# Patient Record
Sex: Female | Born: 1965 | Hispanic: No | Marital: Married | Smoking: Current every day smoker
Health system: Southern US, Community
[De-identification: ages and names within clinical notes are randomized; demographics above are authoritative.]

## PROBLEM LIST (undated history)

## (undated) DIAGNOSIS — M1990 Unspecified osteoarthritis, unspecified site: Secondary | ICD-10-CM

## (undated) HISTORY — PX: KNEE SURGERY: SHX244

## (undated) HISTORY — PX: BREAST ENHANCEMENT SURGERY: SHX7

---

## 1998-08-05 ENCOUNTER — Ambulatory Visit (HOSPITAL_COMMUNITY): Admission: RE | Admit: 1998-08-05 | Discharge: 1998-08-05 | Payer: Self-pay | Admitting: *Deleted

## 1998-09-08 ENCOUNTER — Ambulatory Visit (HOSPITAL_COMMUNITY): Admission: RE | Admit: 1998-09-08 | Discharge: 1998-09-08 | Payer: Self-pay | Admitting: Obstetrics & Gynecology

## 1999-01-03 ENCOUNTER — Encounter: Payer: Self-pay | Admitting: *Deleted

## 1999-01-03 ENCOUNTER — Inpatient Hospital Stay (HOSPITAL_COMMUNITY): Admission: AD | Admit: 1999-01-03 | Discharge: 1999-01-03 | Payer: Self-pay | Admitting: *Deleted

## 1999-01-31 ENCOUNTER — Inpatient Hospital Stay (HOSPITAL_COMMUNITY): Admission: AD | Admit: 1999-01-31 | Discharge: 1999-02-03 | Payer: Self-pay | Admitting: Obstetrics & Gynecology

## 1999-02-06 ENCOUNTER — Encounter (HOSPITAL_COMMUNITY): Admission: RE | Admit: 1999-02-06 | Discharge: 1999-05-07 | Payer: Self-pay | Admitting: *Deleted

## 2001-04-10 ENCOUNTER — Encounter: Admission: RE | Admit: 2001-04-10 | Discharge: 2001-04-10 | Payer: Self-pay

## 2009-08-02 ENCOUNTER — Ambulatory Visit (HOSPITAL_BASED_OUTPATIENT_CLINIC_OR_DEPARTMENT_OTHER): Admission: RE | Admit: 2009-08-02 | Discharge: 2009-08-02 | Payer: Self-pay | Admitting: Obstetrics and Gynecology

## 2009-08-02 ENCOUNTER — Ambulatory Visit: Payer: Self-pay | Admitting: Diagnostic Radiology

## 2009-08-02 ENCOUNTER — Ambulatory Visit: Payer: Self-pay | Admitting: Radiology

## 2010-11-09 ENCOUNTER — Other Ambulatory Visit: Payer: Self-pay | Admitting: Obstetrics and Gynecology

## 2010-11-21 ENCOUNTER — Other Ambulatory Visit (HOSPITAL_COMMUNITY)
Admission: RE | Admit: 2010-11-21 | Discharge: 2010-11-21 | Disposition: A | Discharge status: Home / Self Care | Payer: Managed Care, Other (non HMO) | Source: Ambulatory Visit | Attending: Obstetrics and Gynecology | Admitting: Obstetrics and Gynecology

## 2010-11-21 ENCOUNTER — Other Ambulatory Visit: Payer: Self-pay | Admitting: Obstetrics and Gynecology

## 2010-11-21 DIAGNOSIS — Z1159 Encounter for screening for other viral diseases: Secondary | ICD-10-CM | POA: Insufficient documentation

## 2010-11-21 DIAGNOSIS — Z1231 Encounter for screening mammogram for malignant neoplasm of breast: Secondary | ICD-10-CM

## 2010-11-21 DIAGNOSIS — Z01419 Encounter for gynecological examination (general) (routine) without abnormal findings: Secondary | ICD-10-CM | POA: Insufficient documentation

## 2010-11-29 ENCOUNTER — Ambulatory Visit (HOSPITAL_BASED_OUTPATIENT_CLINIC_OR_DEPARTMENT_OTHER)
Admission: RE | Admit: 2010-11-29 | Discharge: 2010-11-29 | Disposition: A | Discharge status: Home / Self Care | Payer: Managed Care, Other (non HMO) | Source: Ambulatory Visit | Attending: Obstetrics and Gynecology | Admitting: Obstetrics and Gynecology

## 2010-11-29 DIAGNOSIS — Z1231 Encounter for screening mammogram for malignant neoplasm of breast: Secondary | ICD-10-CM | POA: Insufficient documentation

## 2011-04-25 IMAGING — US US PELVIS COMPLETE
1 series · 14 of 25 positions shown · non-contrast
Comparison: None.

CLINICAL DATA: Enlarged uterus; family history of uterine cancer

TRANSABDOMINAL AND TRANSVAGINAL ULTRASOUND OF PELVIS
TECHNIQUE: Both transabdominal and transvaginal ultrasound
examinations of the pelvis were performed including evaluation of
the uterus, ovaries, adnexal regions, and pelvic cul-de-sac.

[Series 1: us pelvis complete · 0.30mm/px · 14 of 59 slices shown]
[im 1/59]
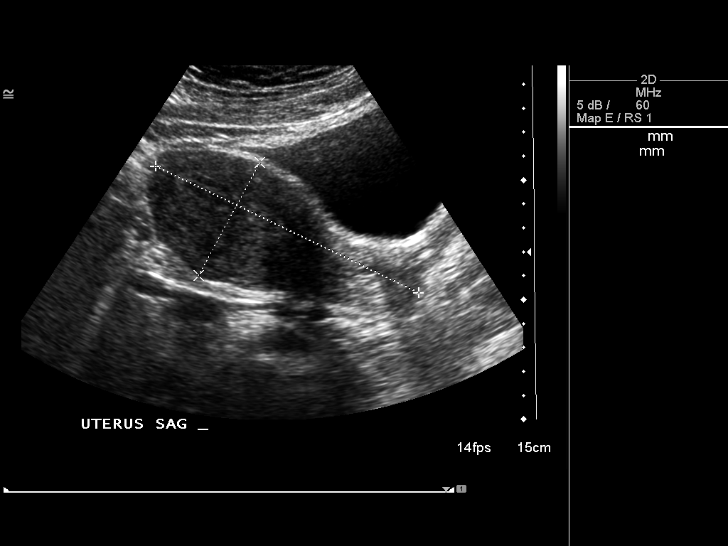
[im 5/59]
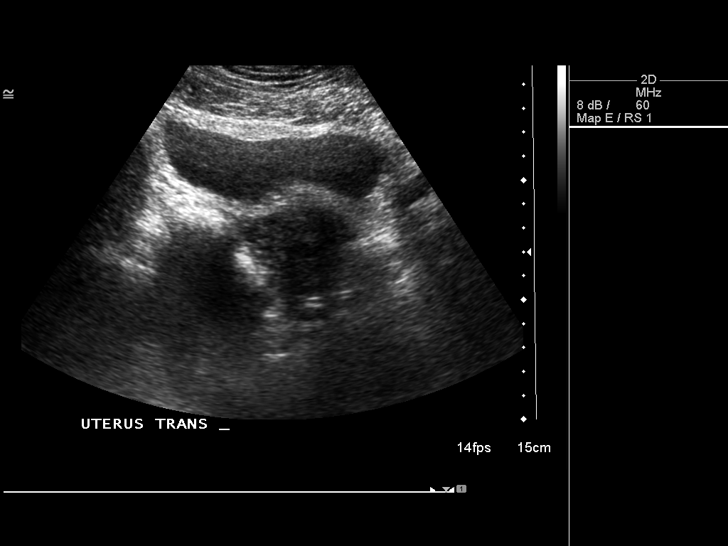
[im 10/59]
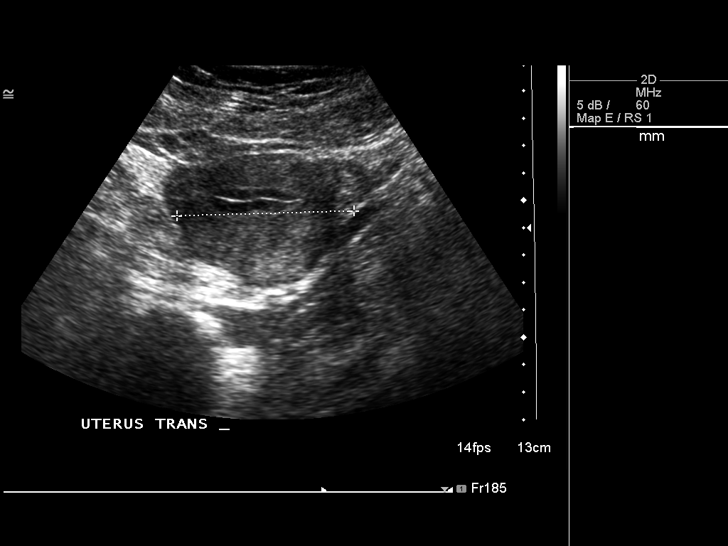
[im 15/59]
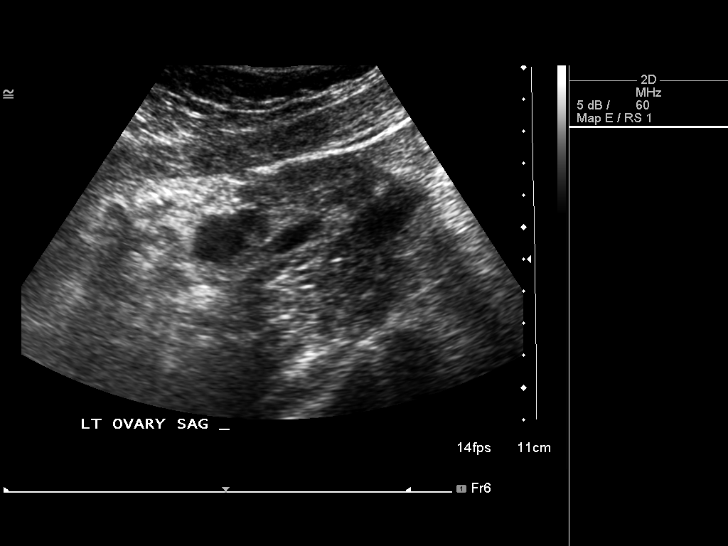
[im 20/59]
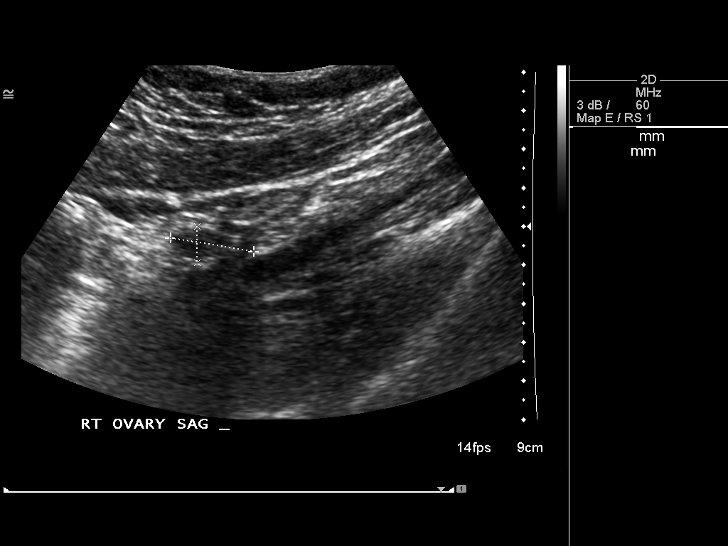
[im 22/59]
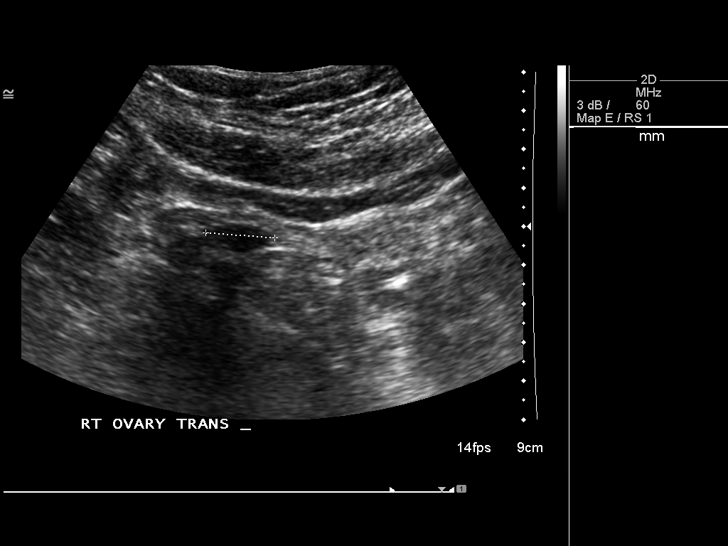
[im 27/59]
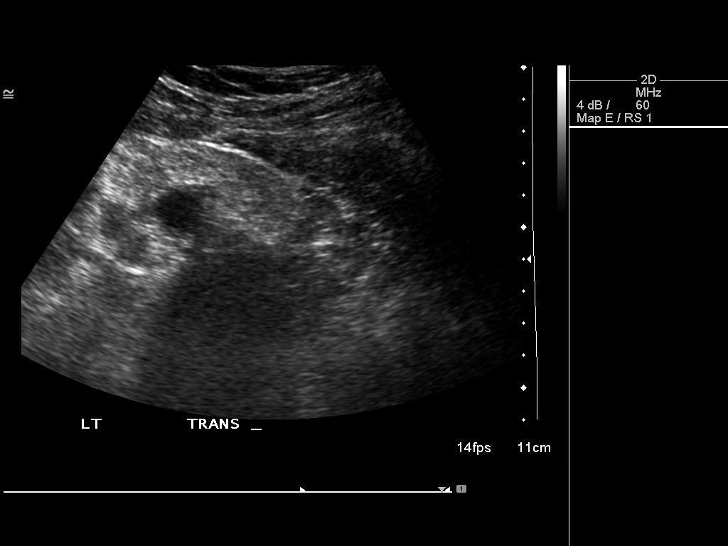
[im 32/59]
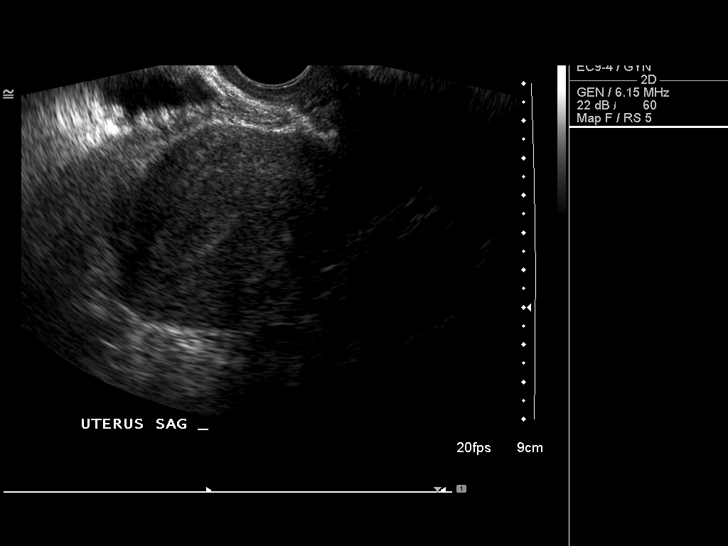
[im 37/59]
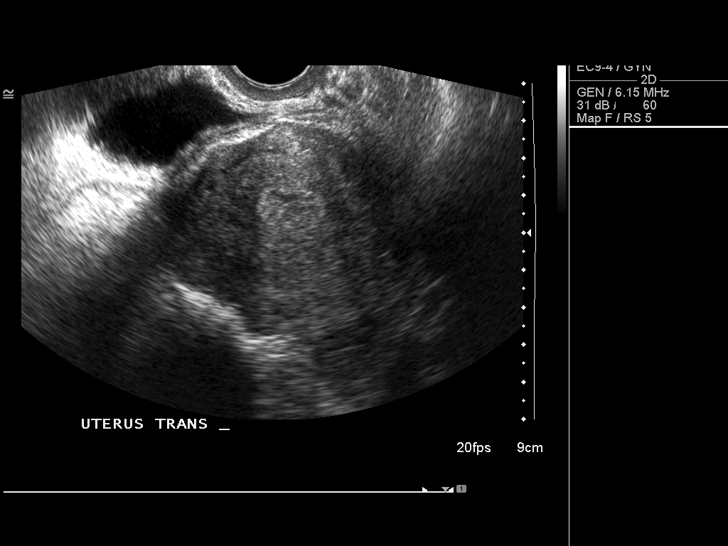
[im 39/59]
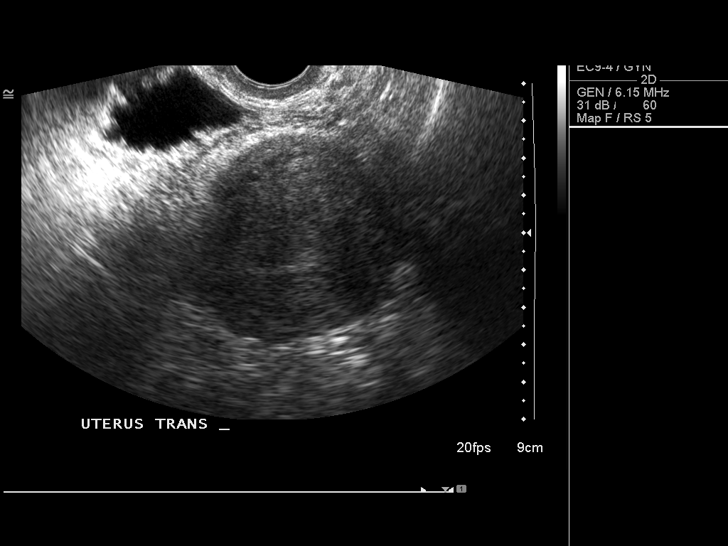
[im 44/59]
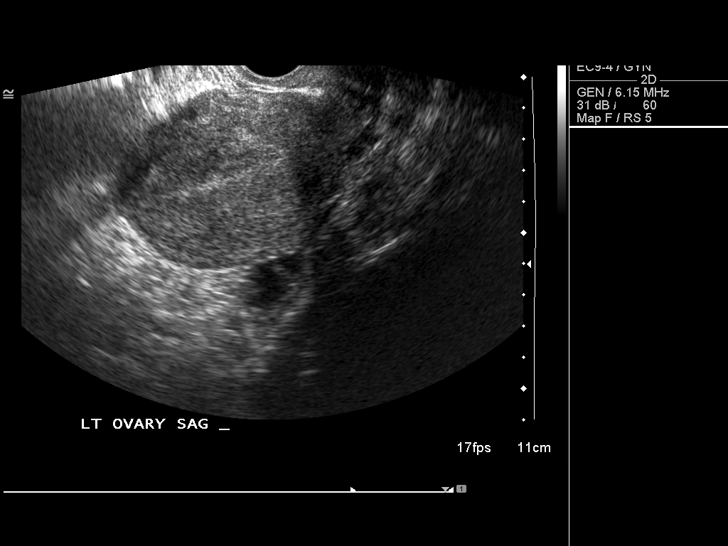
[im 49/59]
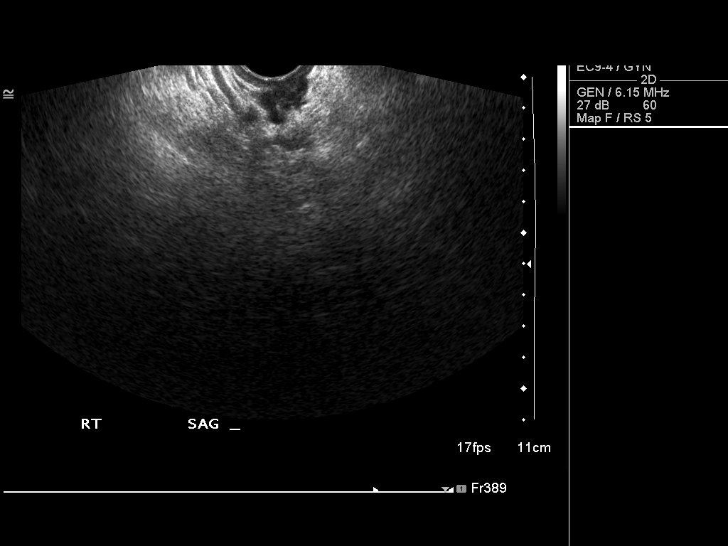
[im 54/59]
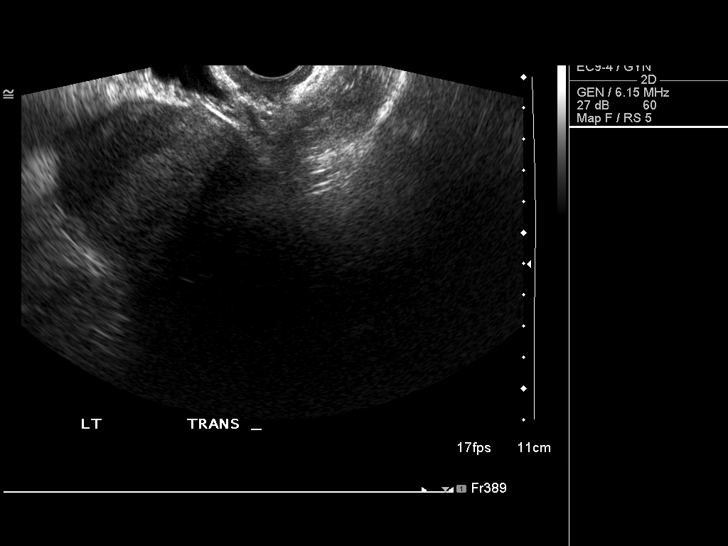
[im 59/59]
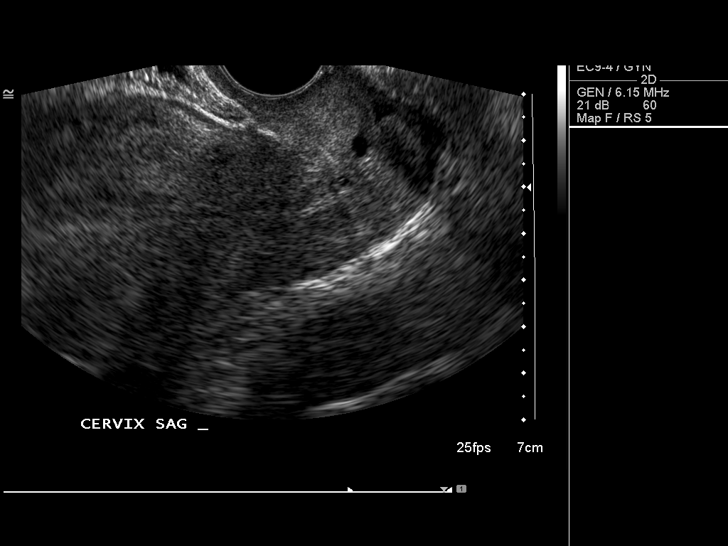

[14 of 25 positions shown; findings below may reference images not displayed]

FINDINGS: The uterus is normal in size and echotexture, measuring
10.8 x 5.5 x 6.5 cm.  Endometrial stripe is homogeneous and within
normal limits in width, measuring 12 mm.

Both ovaries have a normal size and appearance.  The right ovary
measures 2.2 x 0.9 x 1.8 cm, and the left ovary measures 2.8 x
x 2.9 cm.  There are no adnexal masses or free pelvic fluid.
IMPRESSION: Normal pelvic ultrasound.

## 2014-05-03 DIAGNOSIS — M5440 Lumbago with sciatica, unspecified side: Secondary | ICD-10-CM | POA: Insufficient documentation

## 2014-05-03 DIAGNOSIS — M25569 Pain in unspecified knee: Secondary | ICD-10-CM | POA: Insufficient documentation

## 2014-05-03 DIAGNOSIS — M47817 Spondylosis without myelopathy or radiculopathy, lumbosacral region: Secondary | ICD-10-CM | POA: Insufficient documentation

## 2014-11-04 DIAGNOSIS — M2240 Chondromalacia patellae, unspecified knee: Secondary | ICD-10-CM | POA: Insufficient documentation

## 2014-11-04 DIAGNOSIS — M23329 Other meniscus derangements, posterior horn of medial meniscus, unspecified knee: Secondary | ICD-10-CM | POA: Insufficient documentation

## 2015-09-01 ENCOUNTER — Encounter: Payer: Self-pay | Admitting: Podiatry

## 2015-09-01 ENCOUNTER — Ambulatory Visit (INDEPENDENT_AMBULATORY_CARE_PROVIDER_SITE_OTHER): Payer: Managed Care, Other (non HMO)

## 2015-09-01 ENCOUNTER — Ambulatory Visit (INDEPENDENT_AMBULATORY_CARE_PROVIDER_SITE_OTHER): Payer: Managed Care, Other (non HMO) | Admitting: Podiatry

## 2015-09-01 VITALS — BP 102/62 | HR 69 | Resp 16

## 2015-09-01 DIAGNOSIS — M722 Plantar fascial fibromatosis: Secondary | ICD-10-CM

## 2015-09-01 DIAGNOSIS — M79672 Pain in left foot: Secondary | ICD-10-CM

## 2015-09-01 MED ORDER — METHYLPREDNISOLONE 4 MG PO TBPK: ORAL_TABLET | ORAL | Status: DC

## 2015-09-01 MED ORDER — MELOXICAM 15 MG PO TABS: 15.0000 mg | ORAL_TABLET | Freq: Every day | ORAL | Status: DC

## 2015-09-01 NOTE — Progress Notes (Signed)
   Subjective:    Patient ID: Joann Watson, female    DOB: 09-19-1965, 50 y.o.   MRN: [id number]  HPI: She presents today with chief complaint of heel pain to the left heel. Mornings are particularly bad at any time after she's been sitting for a while(her foot hurts. She states that this is been going on for approximately 8 months. She has tried nothing to help alleviate it.    Review of Systems  All other systems reviewed and are negative.      Objective:   Physical Exam: Vital signs are stable she is alert and oriented 3 pulses are palpable. Neurologic sensorium is intact. Degenerative flexors are intact muscle strength is +5 over 5 dorsiflexion plantar flexors and inverters everters all intrinsic musculature is intact. Orthopedic evaluation was resolved with distal to the ankle for range of motion without crepitation. She has pain on palpation medially continue tubercle the left heel. Radiographs consistent with soft tissue increase in density and at the calcaneal insertion site consistent with plantar fasciitis. Cutaneous evaluation demonstrates no open lesions or wounds.          Assessment & Plan:  Plantar fasciitis left foot.  Plan: Discussed etiology pathology conservative versus surgical therapies at this point we injected left heel today with Kenalog and local anesthetic place her in a plantar fascia brace to be followed by a night splint at bedtime. I also placed her on a Medrol Dosepak to be followed by meloxicam. We discussed and demonstrated stretching exercises and a handout was provided for her I will follow-up with her in 1 month

## 2015-09-01 NOTE — Patient Instructions (Signed)

## 2015-09-29 ENCOUNTER — Ambulatory Visit (INDEPENDENT_AMBULATORY_CARE_PROVIDER_SITE_OTHER): Payer: Managed Care, Other (non HMO) | Admitting: Podiatry

## 2015-09-29 DIAGNOSIS — M722 Plantar fascial fibromatosis: Secondary | ICD-10-CM

## 2015-10-01 ENCOUNTER — Other Ambulatory Visit: Payer: Self-pay | Admitting: Podiatry

## 2015-10-01 NOTE — Progress Notes (Signed)
Presents today for follow-up of plantar fasciitis. She states that she is doing very well no complications.  Objective: Vital signs are stable alert and oriented 3 pulses are palpable. No reproducible pain on palpation.  Assessment: Well-healing plantar fasciitis.  Plan: Follow up with me as needed.

## 2015-10-05 ENCOUNTER — Telehealth: Payer: Self-pay | Admitting: *Deleted

## 2015-10-05 NOTE — Telephone Encounter (Signed)
Pt states Dr.Hyatt wanted her to take the medication.  I told pt Dr. Al Corpus had no changes in her medications and that since she had finished the steroid pack she was to continue the Meloxicam until he saw her again.  Pt states understanding.

## 2016-03-03 ENCOUNTER — Encounter (HOSPITAL_COMMUNITY): Payer: Self-pay | Admitting: Emergency Medicine

## 2016-03-03 ENCOUNTER — Emergency Department (HOSPITAL_COMMUNITY): Payer: Managed Care, Other (non HMO)

## 2016-03-03 ENCOUNTER — Emergency Department (HOSPITAL_COMMUNITY)
Admission: EM | Admit: 2016-03-03 | Discharge: 2016-03-03 | Disposition: A | Discharge status: Home / Self Care | Payer: Managed Care, Other (non HMO) | Attending: Emergency Medicine | Admitting: Emergency Medicine

## 2016-03-03 DIAGNOSIS — R0789 Other chest pain: Secondary | ICD-10-CM

## 2016-03-03 DIAGNOSIS — Y999 Unspecified external cause status: Secondary | ICD-10-CM | POA: Diagnosis not present

## 2016-03-03 DIAGNOSIS — R072 Precordial pain: Secondary | ICD-10-CM | POA: Diagnosis not present

## 2016-03-03 DIAGNOSIS — Y92410 Unspecified street and highway as the place of occurrence of the external cause: Secondary | ICD-10-CM | POA: Diagnosis not present

## 2016-03-03 DIAGNOSIS — F17200 Nicotine dependence, unspecified, uncomplicated: Secondary | ICD-10-CM | POA: Diagnosis not present

## 2016-03-03 DIAGNOSIS — Y939 Activity, unspecified: Secondary | ICD-10-CM | POA: Insufficient documentation

## 2016-03-03 DIAGNOSIS — M542 Cervicalgia: Secondary | ICD-10-CM | POA: Diagnosis not present

## 2016-03-03 LAB — CBC
HEMATOCRIT: 40 % (ref 36.0–46.0)
Hemoglobin: 14.3 g/dL (ref 12.0–15.0)
MCH: 31.1 pg (ref 26.0–34.0)
MCHC: 35.8 g/dL (ref 30.0–36.0)
MCV: 87 fL (ref 78.0–100.0)
PLATELETS: 176 10*3/uL (ref 150–400)
RBC: 4.6 MIL/uL (ref 3.87–5.11)
RDW: 12.7 % (ref 11.5–15.5)
WBC: 5.3 10*3/uL (ref 4.0–10.5)

## 2016-03-03 LAB — BASIC METABOLIC PANEL
Anion gap: 9 (ref 5–15)
BUN: 13 mg/dL (ref 6–20)
CHLORIDE: 108 mmol/L (ref 101–111)
CO2: 23 mmol/L (ref 22–32)
Calcium: 8.6 mg/dL — ABNORMAL LOW (ref 8.9–10.3)
Creatinine, Ser: 0.74 mg/dL (ref 0.44–1.00)
Glucose, Bld: 107 mg/dL — ABNORMAL HIGH (ref 65–99)
POTASSIUM: 3.5 mmol/L (ref 3.5–5.1)
SODIUM: 140 mmol/L (ref 135–145)

## 2016-03-03 LAB — I-STAT TROPONIN, ED: Troponin i, poc: 0 ng/mL (ref 0.00–0.08)

## 2016-03-03 MED ORDER — NAPROXEN 250 MG PO TABS: 250.0000 mg | ORAL_TABLET | Freq: Two times a day (BID) | ORAL | 0 refills | Status: DC
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
## 2016-03-03 NOTE — ED Notes (Signed)
[employer] C-collar
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
## 2016-03-03 NOTE — ED Notes (Signed)
Pt stable, understands discharge instructions, and reasons for return.   
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
## 2018-09-18 ENCOUNTER — Other Ambulatory Visit: Payer: Self-pay

## 2018-09-18 ENCOUNTER — Ambulatory Visit
Admission: RE | Admit: 2018-09-18 | Discharge: 2018-09-18 | Disposition: A | Discharge status: Home / Self Care | Payer: Managed Care, Other (non HMO) | Source: Ambulatory Visit | Attending: Family Medicine | Admitting: Family Medicine

## 2018-09-18 ENCOUNTER — Other Ambulatory Visit: Payer: Self-pay | Admitting: Family Medicine

## 2018-09-18 DIAGNOSIS — M25562 Pain in left knee: Secondary | ICD-10-CM

## 2018-09-18 DIAGNOSIS — M25561 Pain in right knee: Secondary | ICD-10-CM

## 2018-10-08 ENCOUNTER — Other Ambulatory Visit (HOSPITAL_COMMUNITY)
Admission: RE | Admit: 2018-10-08 | Discharge: 2018-10-08 | Disposition: A | Discharge status: Home / Self Care | Payer: 59 | Source: Ambulatory Visit | Attending: Nurse Practitioner | Admitting: Nurse Practitioner

## 2018-10-08 DIAGNOSIS — Z01419 Encounter for gynecological examination (general) (routine) without abnormal findings: Secondary | ICD-10-CM | POA: Insufficient documentation

## 2018-10-08 DIAGNOSIS — Z124 Encounter for screening for malignant neoplasm of cervix: Secondary | ICD-10-CM | POA: Insufficient documentation

## 2018-10-09 ENCOUNTER — Other Ambulatory Visit: Payer: Self-pay

## 2018-10-13 LAB — CYTOLOGY - PAP
Chlamydia: NEGATIVE
Diagnosis: NEGATIVE
HPV: NOT DETECTED
Herpes: NEGATIVE
Neisseria Gonorrhea: NEGATIVE
Trichomonas: NEGATIVE

## 2019-03-30 ENCOUNTER — Other Ambulatory Visit: Payer: Self-pay | Admitting: Nurse Practitioner

## 2019-03-30 ENCOUNTER — Other Ambulatory Visit (HOSPITAL_COMMUNITY)
Admission: RE | Admit: 2019-03-30 | Discharge: 2019-03-30 | Disposition: A | Discharge status: Home / Self Care | Payer: No Typology Code available for payment source | Source: Ambulatory Visit | Attending: Nurse Practitioner | Admitting: Nurse Practitioner

## 2019-03-30 DIAGNOSIS — Z124 Encounter for screening for malignant neoplasm of cervix: Secondary | ICD-10-CM | POA: Insufficient documentation

## 2019-04-20 LAB — CYTOLOGY - PAP
Comment: NEGATIVE
Comment: NEGATIVE
Diagnosis: NEGATIVE
Diagnosis: REACTIVE
HPV 16: NEGATIVE
HPV 18 / 45: NEGATIVE
High risk HPV: POSITIVE — AB

## 2020-06-10 IMAGING — CR LEFT KNEE - COMPLETE 4+ VIEW
4 series · 4 of 4 positions shown · non-contrast
Comparison: None.

CLINICAL DATA: Knee pain, no injury, left worse than right

EXAM:
LEFT KNEE - COMPLETE 4+ VIEW; RIGHT KNEE - COMPLETE 4+ VIEW

[w knee ap left]
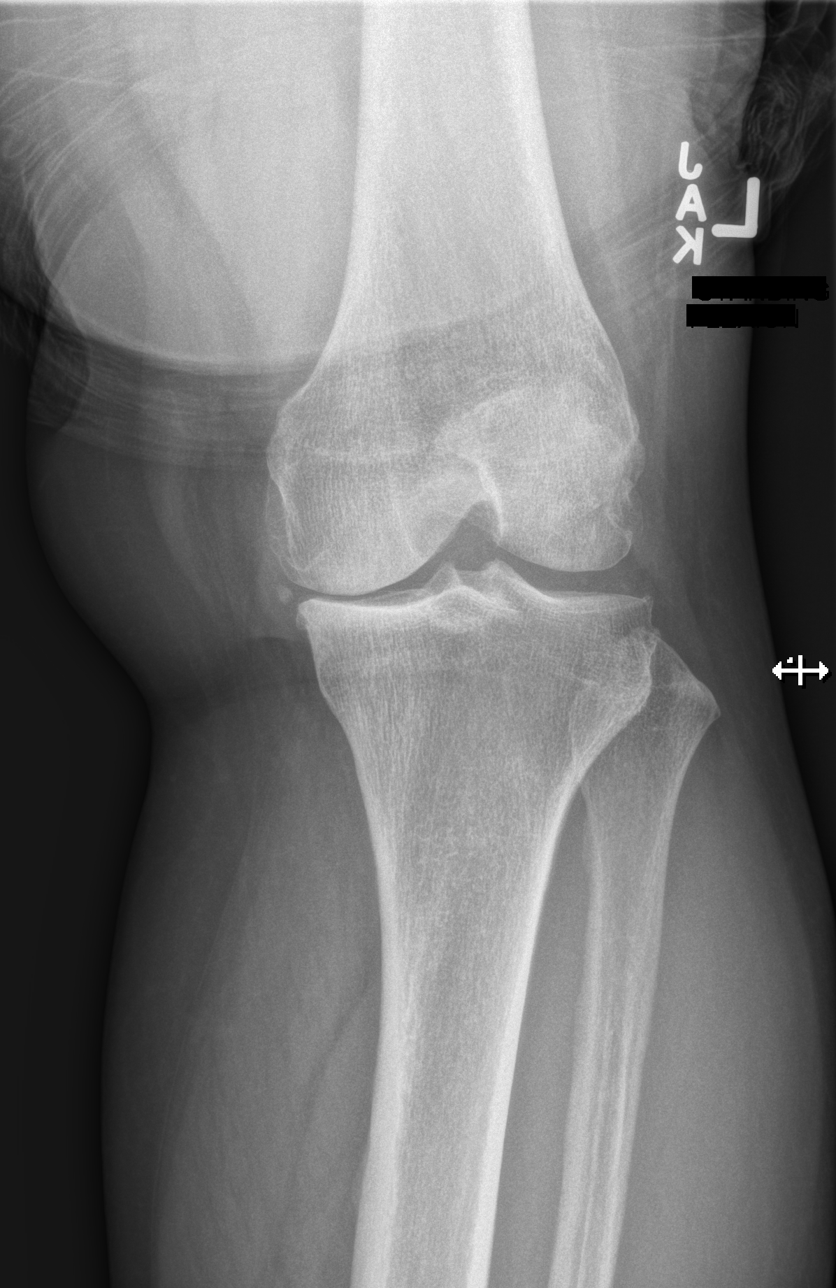

[w knee lat left (1 of 2)]
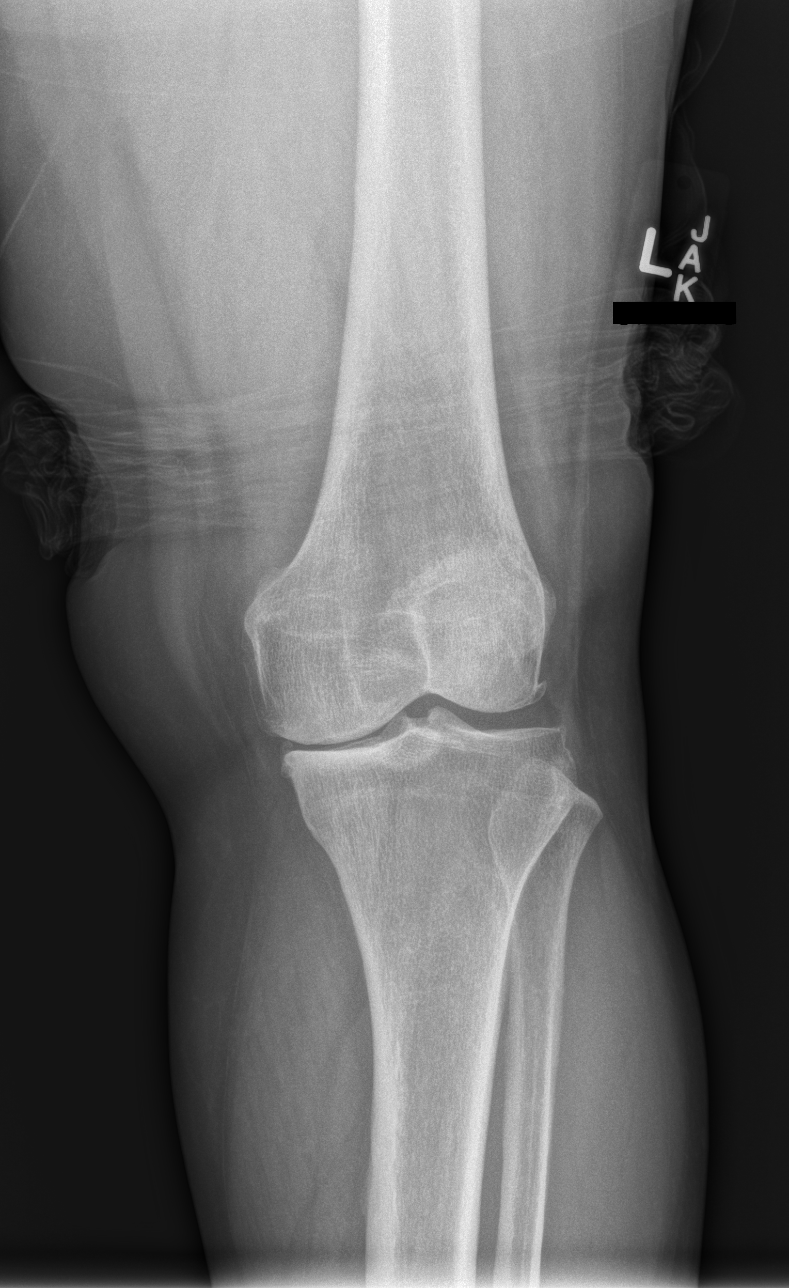

[w knee lat left (2 of 2)]
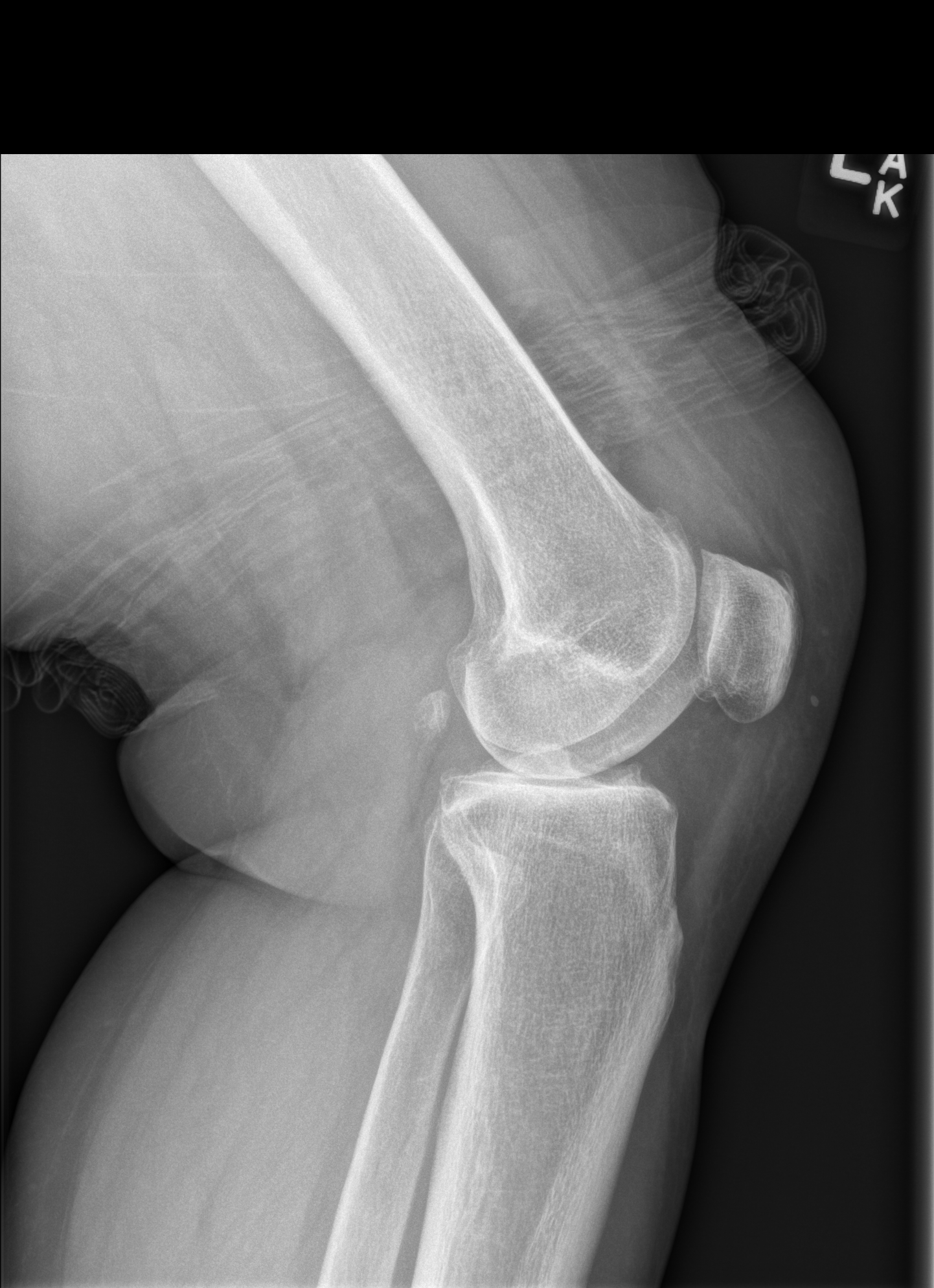

[x knee tunnel left]
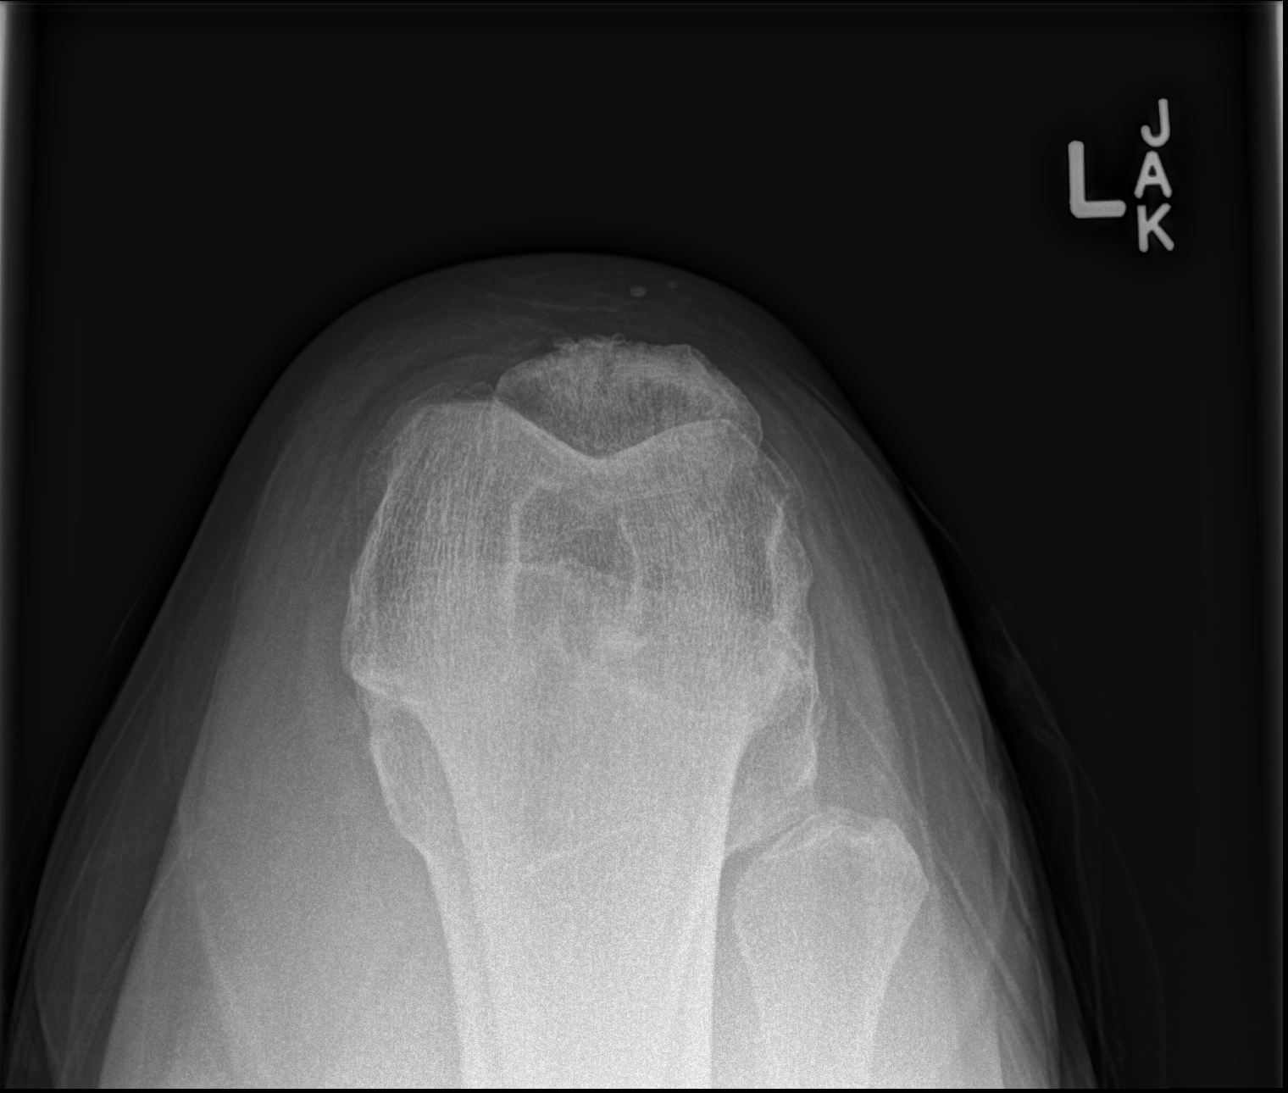

[4 of 4 positions shown; findings below may reference images not displayed]

FINDINGS: No fracture or dislocation of the bilateral knees. There is
generally moderate bilateral tricompartmental arthrosis, worst in
the medial compartment of the left knee with near-total joint space
loss. There are small bilateral nonspecific knee joint effusions.
IMPRESSION: No fracture or dislocation of the bilateral knees. There is
generally moderate bilateral tricompartmental arthrosis, worst in
the medial compartment of the left knee with near-total joint space
loss. There are small bilateral nonspecific knee joint effusions.

## 2020-10-25 ENCOUNTER — Other Ambulatory Visit: Payer: Self-pay | Admitting: Physician Assistant

## 2020-11-23 ENCOUNTER — Ambulatory Visit: Payer: Self-pay | Admitting: Physician Assistant

## 2020-11-23 NOTE — H&P (Signed)
TOTAL KNEE ADMISSION H&P  Patient is being admitted for left total knee arthroplasty.  Subjective:  Chief Complaint:left knee pain.  HPI: Joann Watson, 55 y.o. female, has a history of pain and functional disability in the left knee due to arthritis and has failed non-surgical conservative treatments for greater than 12 weeks to includeNSAID's and/or analgesics, corticosteriod injections, viscosupplementation injections, and activity modification.  Onset of symptoms was gradual, starting 5 years ago with gradually worsening course since that time. The patient noted no past surgery on the left knee(s).  Patient currently rates pain in the left knee(s) at 8 out of 10 with activity. Patient has night pain, worsening of pain with activity and weight bearing, pain that interferes with activities of daily living, pain with passive range of motion, crepitus, and joint swelling.  Patient has evidence of periarticular osteophytes and joint space narrowing by imaging studies. There is no active infection.  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  Past Surgical History:  Procedure Laterality Date   KNEE SURGERY      Current Outpatient Medications  Medication Sig Dispense Refill Last Dose   methylPREDNISolone (MEDROL) 4 MG TBPK tablet Tapering 6 day dose pack 21 tablet 0    naproxen (NAPROSYN) 250 MG tablet Take 1 tablet (250 mg total) by mouth 2 (two) times daily with a meal. 30 tablet 0    No current facility-administered medications for this visit.   No Known Allergies  Social History   Tobacco Use   Smoking status: Every Day   Smokeless tobacco: Not on file  Substance Use Topics   Alcohol use: Yes    Alcohol/week: 0.0 standard  drinks    No family history on file.   Review of Systems  HENT:  Positive for hearing loss.   Respiratory:  Positive for chest tightness.   Musculoskeletal:  Positive for arthralgias.  Neurological:  Positive for headaches.  All other systems reviewed and are negative.  Objective:  Physical Exam Constitutional:      General: She is not in acute distress.    Appearance: Normal appearance.  HENT:     Head: Normocephalic and atraumatic.  Eyes:     Extraocular Movements: Extraocular movements intact.     Pupils: Pupils are equal, round, and reactive to light.  Cardiovascular:     Rate and Rhythm: Normal rate and regular rhythm.     Pulses: Normal pulses.     Heart sounds: Normal heart sounds.  Pulmonary:     Effort: Pulmonary effort is normal. No respiratory distress.     Breath sounds: Normal breath sounds.  Abdominal:     General: Abdomen is flat. Bowel sounds are normal. There is no distension.     Palpations: Abdomen is soft.     Tenderness: There is no abdominal tenderness.  Musculoskeletal:     Cervical back: Normal range of motion and neck supple.     Left knee: Swelling and bony tenderness present. Tenderness present over the medial joint line.  Lymphadenopathy:     Cervical: No cervical adenopathy.  Skin:    General: Skin is warm and dry.     Findings: No erythema or rash.  Neurological:     General: No focal deficit present.  Mental Status: She is alert and oriented to person, place, and time.  Psychiatric:        Mood and Affect: Mood normal.        Behavior: Behavior normal.    Vital signs in last 24 hours: @VSRANGES @  Labs:   Estimated body mass index is 35.71 kg/m as calculated from the following:   Height as of 03/03/16: 5\' 1"  (1.549 m).   Weight as of 03/03/16: 85.7 kg.   Imaging Review Plain radiographs demonstrate moderate degenerative joint disease of the left knee(s). The overall alignment ismild varus. The bone quality appears to be  good for age and reported activity level.      Assessment/Plan:  End stage arthritis, left knee   The patient history, physical examination, clinical judgment of the provider and imaging studies are consistent with end stage degenerative joint disease of the left knee(s) and total knee arthroplasty is deemed medically necessary. The treatment options including medical management, injection therapy arthroscopy and arthroplasty were discussed at length. The risks and benefits of total knee arthroplasty were presented and reviewed. The risks due to aseptic loosening, infection, stiffness, patella tracking problems, thromboembolic complications and other imponderables were discussed. The patient acknowledged the explanation, agreed to proceed with the plan and consent was signed. Patient is being admitted for inpatient treatment for surgery, pain control, PT, OT, prophylactic antibiotics, VTE prophylaxis, progressive ambulation and ADL's and discharge planning. The patient is planning to be discharged  home with outpt PT    Anticipated LOS equal to or greater than 2 midnights due to - Age 84 and older with one or more of the following:  - Obesity  - Expected need for hospital services (PT, OT, Nursing) required for safe  discharge  - Anticipated need for postoperative skilled nursing care or inpatient rehab  - Active co-morbidities: None OR   - Unanticipated findings during/Post Surgery: None  - Patient is a high risk of re-admission due to: None

## 2020-11-23 NOTE — H&P (View-Only) (Signed)
TOTAL KNEE ADMISSION H&P  Patient is being admitted for left total knee arthroplasty.  Subjective:  Chief Complaint:left knee pain.  HPI: Joann Watson, 55 y.o. female, has a history of pain and functional disability in the left knee due to arthritis and has failed non-surgical conservative treatments for greater than 12 weeks to includeNSAID's and/or analgesics, corticosteriod injections, viscosupplementation injections, and activity modification.  Onset of symptoms was gradual, starting 5 years ago with gradually worsening course since that time. The patient noted no past surgery on the left knee(s).  Patient currently rates pain in the left knee(s) at 8 out of 10 with activity. Patient has night pain, worsening of pain with activity and weight bearing, pain that interferes with activities of daily living, pain with passive range of motion, crepitus, and joint swelling.  Patient has evidence of periarticular osteophytes and joint space narrowing by imaging studies. There is no active infection.  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  Past Surgical History:  Procedure Laterality Date   KNEE SURGERY      Current Outpatient Medications  Medication Sig Dispense Refill Last Dose   methylPREDNISolone (MEDROL) 4 MG TBPK tablet Tapering 6 day dose pack 21 tablet 0    naproxen (NAPROSYN) 250 MG tablet Take 1 tablet (250 mg total) by mouth 2 (two) times daily with a meal. 30 tablet 0    No current facility-administered medications for this visit.   No Known Allergies  Social History   Tobacco Use   Smoking status: Every Day   Smokeless tobacco: Not on file  Substance Use Topics   Alcohol use: Yes    Alcohol/week: 0.0 standard  drinks    No family history on file.   Review of Systems  HENT:  Positive for hearing loss.   Respiratory:  Positive for chest tightness.   Musculoskeletal:  Positive for arthralgias.  Neurological:  Positive for headaches.  All other systems reviewed and are negative.  Objective:  Physical Exam Constitutional:      General: She is not in acute distress.    Appearance: Normal appearance.  HENT:     Head: Normocephalic and atraumatic.  Eyes:     Extraocular Movements: Extraocular movements intact.     Pupils: Pupils are equal, round, and reactive to light.  Cardiovascular:     Rate and Rhythm: Normal rate and regular rhythm.     Pulses: Normal pulses.     Heart sounds: Normal heart sounds.  Pulmonary:     Effort: Pulmonary effort is normal. No respiratory distress.     Breath sounds: Normal breath sounds.  Abdominal:     General: Abdomen is flat. Bowel sounds are normal. There is no distension.     Palpations: Abdomen is soft.     Tenderness: There is no abdominal tenderness.  Musculoskeletal:     Cervical back: Normal range of motion and neck supple.     Left knee: Swelling and bony tenderness present. Tenderness present over the medial joint line.  Lymphadenopathy:     Cervical: No cervical adenopathy.  Skin:    General: Skin is warm and dry.     Findings: No erythema or rash.  Neurological:     General: No focal deficit present.  Mental Status: She is alert and oriented to person, place, and time.  Psychiatric:        Mood and Affect: Mood normal.        Behavior: Behavior normal.    Vital signs in last 24 hours: @VSRANGES @  Labs:   Estimated body mass index is 35.71 kg/m as calculated from the following:   Height as of 03/03/16: 5\' 1"  (1.549 m).   Weight as of 03/03/16: 85.7 kg.   Imaging Review Plain radiographs demonstrate moderate degenerative joint disease of the left knee(s). The overall alignment ismild varus. The bone quality appears to be  good for age and reported activity level.      Assessment/Plan:  End stage arthritis, left knee   The patient history, physical examination, clinical judgment of the provider and imaging studies are consistent with end stage degenerative joint disease of the left knee(s) and total knee arthroplasty is deemed medically necessary. The treatment options including medical management, injection therapy arthroscopy and arthroplasty were discussed at length. The risks and benefits of total knee arthroplasty were presented and reviewed. The risks due to aseptic loosening, infection, stiffness, patella tracking problems, thromboembolic complications and other imponderables were discussed. The patient acknowledged the explanation, agreed to proceed with the plan and consent was signed. Patient is being admitted for inpatient treatment for surgery, pain control, PT, OT, prophylactic antibiotics, VTE prophylaxis, progressive ambulation and ADL's and discharge planning. The patient is planning to be discharged  home with outpt PT    Anticipated LOS equal to or greater than 2 midnights due to - Age 84 and older with one or more of the following:  - Obesity  - Expected need for hospital services (PT, OT, Nursing) required for safe  discharge  - Anticipated need for postoperative skilled nursing care or inpatient rehab  - Active co-morbidities: None OR   - Unanticipated findings during/Post Surgery: None  - Patient is a high risk of re-admission due to: None

## 2020-11-30 NOTE — Progress Notes (Signed)
DUE TO COVID-19 ONLY ONE VISITOR IS ALLOWED TO COME WITH YOU AND STAY IN THE WAITING ROOM ONLY DURING PRE OP AND PROCEDURE DAY OF SURGERY.  2 VISITOR  MAY VISIT WITH YOU AFTER SURGERY IN YOUR PRIVATE ROOM DURING VISITING HOURS ONLY!  YOU NEED TO HAVE A COVID 19 TEST ON______AME@_  @_from  8am-3pm _____, THIS TEST MUST BE DONE BEFORE SURGERY,  Covid test is done at [address] [place], [place] [address].  This is a drive thru.  No appt required. Please see map.                 Your procedure is scheduled on:  12/16/2020   Report to [facility] Main  Entrance   Report to admitting at   0730 AM     Call this number if you have problems the morning of surgery [phone]    REMEMBER: NO  SOLID FOOD CANDY OR GUM AFTER MIDNIGHT. CLEAR LIQUIDS UNTIL  0715am         . NOTHING BY MOUTH EXCEPT CLEAR LIQUIDS UNTIL 0715am    . PLEASE FINISH ENSURE DRINK PER SURGEON ORDER  WHICH NEEDS TO BE COMPLETED AT   0715am    .      CLEAR LIQUID DIET   Foods Allowed                                                                    Coffee and tea, regular and decaf                            Fruit ices (not with fruit pulp)                                      Iced Popsicles                                    Carbonated beverages, regular and diet                                    Cranberry, grape and apple juices Sports drinks like Gatorade Lightly seasoned clear broth or consume(fat free) Sugar, honey syrup ___________________________________________________________________      BRUSH YOUR TEETH MORNING OF SURGERY AND RINSE YOUR MOUTH OUT, NO CHEWING GUM CANDY OR MINTS.     Take these medicines the morning of surgery with A SIP OF WATER:     DO NOT TAKE ANY DIABETIC MEDICATIONS DAY OF YOUR SURGERY                               You may not have any metal on your body including hair pins and              piercings  Do not wear jewelry, make-up, lotions, powders or perfumes,  deodorant             Do not wear nail  polish on your fingernails.  Do not shave  48 hours prior to surgery.              Men may shave face and neck.   Do not bring valuables to the hospital. [place].  Contacts, dentures or bridgework may not be worn into surgery.  Leave suitcase in the car. After surgery it may be brought to your room.     Patients discharged the day of surgery will not be allowed to drive home. IF YOU ARE HAVING SURGERY AND GOING HOME THE SAME DAY, YOU MUST HAVE AN ADULT TO DRIVE YOU HOME AND BE WITH YOU FOR 24 HOURS. YOU MAY GO HOME BY TAXI OR UBER OR ORTHERWISE, BUT AN ADULT MUST ACCOMPANY YOU HOME AND STAY WITH YOU FOR 24 HOURS.  Name and phone number of your driver:  Special Instructions: N/A              Please read over the following fact sheets you were given: _____________________________________________________________________  [facility] - Preparing for Surgery Before surgery, you can play an important role.  Because skin is not sterile, your skin needs to be as free of germs as possible.  You can reduce the number of germs on your skin by washing with CHG (chlorahexidine gluconate) soap before surgery.  CHG is an antiseptic cleaner which kills germs and bonds with the skin to continue killing germs even after washing. Please DO NOT use if you have an allergy to CHG or antibacterial soaps.  If your skin becomes reddened/irritated stop using the CHG and inform your nurse when you arrive at Short Stay. Do not shave (including legs and underarms) for at least 48 hours prior to the first CHG shower.  You may shave your face/neck. Please follow these instructions carefully:  1.  Shower with CHG Soap the night before surgery and the  morning of Surgery.  2.  If you choose to wash your hair, wash your hair first as usual with your  normal  shampoo.  3.  After you shampoo, rinse your hair and body thoroughly to remove  the  shampoo.                           4.  Use CHG as you would any other liquid soap.  You can apply chg directly  to the skin and wash                       Gently with a scrungie or clean washcloth.  5.  Apply the CHG Soap to your body ONLY FROM THE NECK DOWN.   Do not use on face/ open                           Wound or open sores. Avoid contact with eyes, ears mouth and genitals (private parts).                       Wash face,  Genitals (private parts) with your normal soap.             6.  Wash thoroughly, paying special attention to the area where your surgery  will be performed.  7.  Thoroughly rinse your body with warm water from the  neck down.  8.  DO NOT shower/wash with your normal soap after using and rinsing off  the CHG Soap.                9.  Pat yourself dry with a clean towel.            10.  Wear clean pajamas.            11.  Place clean sheets on your bed the night of your first shower and do not  sleep with pets. Day of Surgery : Do not apply any lotions/deodorants the morning of surgery.  Please wear clean clothes to the hospital/surgery center.  FAILURE TO FOLLOW THESE INSTRUCTIONS MAY RESULT IN THE CANCELLATION OF YOUR SURGERY PATIENT SIGNATURE_________________________________  NURSE SIGNATURE__________________________________  ________________________________________________________________________

## 2020-11-30 NOTE — Progress Notes (Addendum)
Anesthesia Review:  PCP: DR Renaye Rakers 10/18/20- Clearance in ov note on chart  Cardiologist : Chest x-ray : EKG : Echo : Stress test: Cardiac Cath :  Activity level: can do a flight of stairs without difficulty  Sleep Study/ CPAP : none  Fasting Blood Sugar :      / Checks Blood Sugar -- times a day:   Blood Thinner/ Instructions /Last Dose: ASA / Instructions/ Last Dose :   U/a done 12/05/20 routed to Dr Madelon Lips.

## 2020-12-05 ENCOUNTER — Encounter (HOSPITAL_COMMUNITY)
Admission: RE | Admit: 2020-12-05 | Discharge: 2020-12-05 | Disposition: A | Discharge status: Home / Self Care | Payer: No Typology Code available for payment source | Source: Ambulatory Visit | Attending: Orthopedic Surgery | Admitting: Orthopedic Surgery

## 2020-12-05 ENCOUNTER — Other Ambulatory Visit: Payer: Self-pay

## 2020-12-05 ENCOUNTER — Encounter (HOSPITAL_COMMUNITY): Payer: Self-pay

## 2020-12-05 DIAGNOSIS — Z01812 Encounter for preprocedural laboratory examination: Secondary | ICD-10-CM | POA: Diagnosis not present

## 2020-12-05 HISTORY — DX: Unspecified osteoarthritis, unspecified site: M19.90

## 2020-12-05 LAB — CBC WITH DIFFERENTIAL/PLATELET
Abs Immature Granulocytes: 0.01 10*3/uL (ref 0.00–0.07)
Basophils Absolute: 0 10*3/uL (ref 0.0–0.1)
Basophils Relative: 1 %
Eosinophils Absolute: 0.1 10*3/uL (ref 0.0–0.5)
Eosinophils Relative: 3 %
HCT: 45.4 % (ref 36.0–46.0)
Hemoglobin: 15.4 g/dL — ABNORMAL HIGH (ref 12.0–15.0)
Immature Granulocytes: 0 %
Lymphocytes Relative: 40 %
Lymphs Abs: 1.6 10*3/uL (ref 0.7–4.0)
MCH: 31.3 pg (ref 26.0–34.0)
MCHC: 33.9 g/dL (ref 30.0–36.0)
MCV: 92.3 fL (ref 80.0–100.0)
Monocytes Absolute: 0.4 10*3/uL (ref 0.1–1.0)
Monocytes Relative: 9 %
Neutro Abs: 1.8 10*3/uL (ref 1.7–7.7)
Neutrophils Relative %: 47 %
Platelets: 173 10*3/uL (ref 150–400)
RBC: 4.92 MIL/uL (ref 3.87–5.11)
RDW: 12.4 % (ref 11.5–15.5)
WBC: 3.9 10*3/uL — ABNORMAL LOW (ref 4.0–10.5)
nRBC: 0 % (ref 0.0–0.2)

## 2020-12-05 LAB — URINALYSIS, ROUTINE W REFLEX MICROSCOPIC
Bilirubin Urine: NEGATIVE
Glucose, UA: NEGATIVE mg/dL
Ketones, ur: NEGATIVE mg/dL
Leukocytes,Ua: NEGATIVE
Nitrite: NEGATIVE
Protein, ur: NEGATIVE mg/dL
Specific Gravity, Urine: <1.005 — ABNORMAL LOW (ref 1.005–1.030)
pH: 6 (ref 5.0–8.0)

## 2020-12-05 LAB — COMPREHENSIVE METABOLIC PANEL
ALT: 21 U/L (ref 0–44)
AST: 27 U/L (ref 15–41)
Albumin: 4.2 g/dL (ref 3.5–5.0)
Alkaline Phosphatase: 68 U/L (ref 38–126)
Anion gap: 7 (ref 5–15)
BUN: 10 mg/dL (ref 6–20)
CO2: 28 mmol/L (ref 22–32)
Calcium: 9.2 mg/dL (ref 8.9–10.3)
Chloride: 110 mmol/L (ref 98–111)
Creatinine, Ser: 0.69 mg/dL (ref 0.44–1.00)
GFR, Estimated: >60 mL/min (ref >60)
Glucose, Bld: 83 mg/dL (ref 70–99)
Potassium: 4.5 mmol/L (ref 3.5–5.1)
Sodium: 145 mmol/L (ref 135–145)
Total Bilirubin: 0.6 mg/dL (ref 0.3–1.2)
Total Protein: 7.4 g/dL (ref 6.5–8.1)

## 2020-12-05 LAB — URINALYSIS, MICROSCOPIC (REFLEX): Bacteria, UA: NONE SEEN

## 2020-12-05 LAB — SURGICAL PCR SCREEN
MRSA, PCR: NEGATIVE
Staphylococcus aureus: NEGATIVE

## 2020-12-05 LAB — PROTIME-INR
INR: 1 (ref 0.8–1.2)
Prothrombin Time: 13 seconds (ref 11.4–15.2)

## 2020-12-05 LAB — TYPE AND SCREEN
ABO/RH(D): A POS
Antibody Screen: NEGATIVE

## 2020-12-05 LAB — APTT: aPTT: 28 seconds (ref 24–36)

## 2020-12-15 MED ORDER — TRANEXAMIC ACID 1000 MG/10ML IV SOLN
2000.0000 mg | INTRAVENOUS | Status: DC
  Filled 2020-12-15: qty 20

## 2020-12-16 ENCOUNTER — Ambulatory Visit (HOSPITAL_COMMUNITY)
Admission: RE | Admit: 2020-12-16 | Discharge: 2020-12-16 | Disposition: A | Discharge status: Home / Self Care | Payer: No Typology Code available for payment source | Attending: Orthopedic Surgery | Admitting: Orthopedic Surgery

## 2020-12-16 ENCOUNTER — Ambulatory Visit (HOSPITAL_COMMUNITY): Payer: No Typology Code available for payment source | Admitting: Certified Registered"

## 2020-12-16 ENCOUNTER — Encounter (HOSPITAL_COMMUNITY): Payer: Self-pay | Admitting: Orthopedic Surgery

## 2020-12-16 ENCOUNTER — Encounter (HOSPITAL_COMMUNITY)
Admission: RE | Disposition: A | Discharge status: Home / Self Care | Payer: Self-pay | Source: Home / Self Care | Attending: Orthopedic Surgery

## 2020-12-16 ENCOUNTER — Ambulatory Visit (HOSPITAL_COMMUNITY): Payer: No Typology Code available for payment source | Admitting: Physician Assistant

## 2020-12-16 DIAGNOSIS — M6281 Muscle weakness (generalized): Secondary | ICD-10-CM | POA: Diagnosis not present

## 2020-12-16 DIAGNOSIS — M1712 Unilateral primary osteoarthritis, left knee: Secondary | ICD-10-CM | POA: Insufficient documentation

## 2020-12-16 DIAGNOSIS — Z791 Long term (current) use of non-steroidal anti-inflammatories (NSAID): Secondary | ICD-10-CM | POA: Diagnosis not present

## 2020-12-16 DIAGNOSIS — R262 Difficulty in walking, not elsewhere classified: Secondary | ICD-10-CM | POA: Diagnosis not present

## 2020-12-16 DIAGNOSIS — F17200 Nicotine dependence, unspecified, uncomplicated: Secondary | ICD-10-CM | POA: Insufficient documentation

## 2020-12-16 HISTORY — PX: TOTAL KNEE ARTHROPLASTY: SHX125

## 2020-12-16 LAB — ABO/RH: ABO/RH(D): A POS

## 2020-12-16 SURGERY — ARTHROPLASTY, KNEE, TOTAL
Anesthesia: Spinal | Site: Knee | Laterality: Left

## 2020-12-16 MED ORDER — SODIUM CHLORIDE (PF) 0.9 % IJ SOLN
INTRAMUSCULAR | Status: AC
  Filled 2020-12-16: qty 40

## 2020-12-16 MED ORDER — METOCLOPRAMIDE HCL 5 MG PO TABS
5.0000 mg | ORAL_TABLET | Freq: Three times a day (TID) | ORAL | Status: DC | PRN
  Filled 2020-12-16: qty 2

## 2020-12-16 MED ORDER — METOCLOPRAMIDE HCL 5 MG/ML IJ SOLN: 5.0000 mg | Freq: Three times a day (TID) | INTRAMUSCULAR | Status: DC | PRN

## 2020-12-16 MED ORDER — TRANEXAMIC ACID-NACL 1000-0.7 MG/100ML-% IV SOLN
1000.0000 mg | INTRAVENOUS | Status: AC
  Administered 2020-12-16: 1000 mg via INTRAVENOUS
  Filled 2020-12-16: qty 100

## 2020-12-16 MED ORDER — PHENYLEPHRINE HCL-NACL 20-0.9 MG/250ML-% IV SOLN
INTRAVENOUS | Status: DC | PRN
  Administered 2020-12-16: 30 ug/min via INTRAVENOUS

## 2020-12-16 MED ORDER — POVIDONE-IODINE 10 % EX SWAB
2.0000 "application " | Freq: Once | CUTANEOUS | Status: AC
  Administered 2020-12-16: 2 via TOPICAL

## 2020-12-16 MED ORDER — PROPOFOL 10 MG/ML IV BOLUS
INTRAVENOUS | Status: AC
  Filled 2020-12-16: qty 20

## 2020-12-16 MED ORDER — OXYCODONE HCL 5 MG PO TABS: ORAL_TABLET | ORAL | 0 refills | Status: DC

## 2020-12-16 MED ORDER — PROPOFOL 1000 MG/100ML IV EMUL
INTRAVENOUS | Status: AC
  Filled 2020-12-16: qty 100

## 2020-12-16 MED ORDER — ACETAMINOPHEN 500 MG PO TABS: 1000.0000 mg | ORAL_TABLET | Freq: Four times a day (QID) | ORAL | Status: DC

## 2020-12-16 MED ORDER — LACTATED RINGERS IV BOLUS
250.0000 mL | Freq: Once | INTRAVENOUS | Status: AC
  Administered 2020-12-16: 250 mL via INTRAVENOUS

## 2020-12-16 MED ORDER — CHLORHEXIDINE GLUCONATE 0.12 % MT SOLN
15.0000 mL | Freq: Once | OROMUCOSAL | Status: AC
  Administered 2020-12-16: 15 mL via OROMUCOSAL

## 2020-12-16 MED ORDER — MIDAZOLAM HCL 2 MG/2ML IJ SOLN
1.0000 mg | INTRAMUSCULAR | Status: DC
  Administered 2020-12-16: 1 mg via INTRAVENOUS
  Filled 2020-12-16: qty 2

## 2020-12-16 MED ORDER — ONDANSETRON HCL 4 MG/2ML IJ SOLN: 4.0000 mg | Freq: Four times a day (QID) | INTRAMUSCULAR | Status: DC | PRN

## 2020-12-16 MED ORDER — SODIUM CHLORIDE (PF) 0.9 % IJ SOLN
INTRAMUSCULAR | Status: AC
  Filled 2020-12-16: qty 10

## 2020-12-16 MED ORDER — ONDANSETRON HCL 4 MG/2ML IJ SOLN: 4.0000 mg | Freq: Once | INTRAMUSCULAR | Status: DC | PRN

## 2020-12-16 MED ORDER — LACTATED RINGERS IV SOLN: INTRAVENOUS | Status: DC

## 2020-12-16 MED ORDER — HYDROMORPHONE HCL 1 MG/ML IJ SOLN: 0.5000 mg | INTRAMUSCULAR | Status: DC | PRN

## 2020-12-16 MED ORDER — TRANEXAMIC ACID-NACL 1000-0.7 MG/100ML-% IV SOLN: 1000.0000 mg | Freq: Once | INTRAVENOUS | Status: DC

## 2020-12-16 MED ORDER — STERILE WATER FOR IRRIGATION IR SOLN
Status: DC | PRN
  Administered 2020-12-16: 2000 mL

## 2020-12-16 MED ORDER — ACETAMINOPHEN 10 MG/ML IV SOLN: 1000.0000 mg | Freq: Once | INTRAVENOUS | Status: DC | PRN

## 2020-12-16 MED ORDER — SODIUM CHLORIDE 0.9 % IR SOLN
Status: DC | PRN
Start: 2020-12-16 — End: 2020-12-17
  Administered 2020-12-16: 1000 mL

## 2020-12-16 MED ORDER — FENTANYL CITRATE PF 50 MCG/ML IJ SOSY
50.0000 ug | PREFILLED_SYRINGE | INTRAMUSCULAR | Status: DC
  Administered 2020-12-16: 50 ug via INTRAVENOUS
  Filled 2020-12-16: qty 2

## 2020-12-16 MED ORDER — DEXAMETHASONE SODIUM PHOSPHATE 10 MG/ML IJ SOLN
INTRAMUSCULAR | Status: AC
  Filled 2020-12-16: qty 1

## 2020-12-16 MED ORDER — PHENYLEPHRINE HCL (PRESSORS) 10 MG/ML IV SOLN
INTRAVENOUS | Status: AC
  Filled 2020-12-16: qty 1

## 2020-12-16 MED ORDER — 0.9 % SODIUM CHLORIDE (POUR BTL) OPTIME
TOPICAL | Status: DC | PRN
  Administered 2020-12-16: 1000 mL

## 2020-12-16 MED ORDER — SODIUM CHLORIDE 0.9 % IV SOLN
INTRAVENOUS | Status: DC | PRN
  Administered 2020-12-16: 100 mL

## 2020-12-16 MED ORDER — ONDANSETRON HCL 4 MG/2ML IJ SOLN
INTRAMUSCULAR | Status: DC | PRN
  Administered 2020-12-16: 4 mg via INTRAVENOUS

## 2020-12-16 MED ORDER — HYDROMORPHONE HCL 1 MG/ML IJ SOLN
0.2500 mg | INTRAMUSCULAR | Status: DC | PRN
Start: 2020-12-16 — End: 2020-12-17

## 2020-12-16 MED ORDER — BUPIVACAINE IN DEXTROSE 0.75-8.25 % IT SOLN
INTRATHECAL | Status: DC | PRN
  Administered 2020-12-16: 1.4 mL via INTRATHECAL

## 2020-12-16 MED ORDER — EPHEDRINE 5 MG/ML INJ
INTRAVENOUS | Status: AC
  Filled 2020-12-16: qty 5

## 2020-12-16 MED ORDER — BUPIVACAINE LIPOSOME 1.3 % IJ SUSP: 20.0000 mL | Freq: Once | INTRAMUSCULAR | Status: DC

## 2020-12-16 MED ORDER — DEXAMETHASONE SODIUM PHOSPHATE 10 MG/ML IJ SOLN
INTRAMUSCULAR | Status: DC | PRN
Start: 2020-12-16 — End: 2020-12-16
  Administered 2020-12-16: 8 mg via INTRAVENOUS

## 2020-12-16 MED ORDER — CEFAZOLIN SODIUM-DEXTROSE 1-4 GM/50ML-% IV SOLN: 1.0000 g | Freq: Four times a day (QID) | INTRAVENOUS | Status: DC

## 2020-12-16 MED ORDER — DOCUSATE SODIUM 100 MG PO CAPS: 100.0000 mg | ORAL_CAPSULE | Freq: Every day | ORAL | 2 refills | Status: AC | PRN

## 2020-12-16 MED ORDER — ORAL CARE MOUTH RINSE: 15.0000 mL | Freq: Once | OROMUCOSAL | Status: AC

## 2020-12-16 MED ORDER — PROPOFOL 500 MG/50ML IV EMUL
INTRAVENOUS | Status: DC | PRN
  Administered 2020-12-16: 125 ug/kg/min via INTRAVENOUS

## 2020-12-16 MED ORDER — BUPIVACAINE HCL (PF) 0.5 % IJ SOLN
INTRAMUSCULAR | Status: DC | PRN
  Administered 2020-12-16: 20 mL via PERINEURAL

## 2020-12-16 MED ORDER — SODIUM CHLORIDE 0.9 % IV SOLN: INTRAVENOUS | Status: DC

## 2020-12-16 MED ORDER — CEFAZOLIN SODIUM-DEXTROSE 2-4 GM/100ML-% IV SOLN
2.0000 g | INTRAVENOUS | Status: AC
  Administered 2020-12-16: 2 g via INTRAVENOUS
  Filled 2020-12-16: qty 100

## 2020-12-16 MED ORDER — TRANEXAMIC ACID 1000 MG/10ML IV SOLN
INTRAVENOUS | Status: DC | PRN
  Administered 2020-12-16: 2000 mg via TOPICAL

## 2020-12-16 MED ORDER — LACTATED RINGERS IV BOLUS
500.0000 mL | Freq: Once | INTRAVENOUS | Status: AC
  Administered 2020-12-16: 500 mL via INTRAVENOUS

## 2020-12-16 MED ORDER — ONDANSETRON HCL 4 MG/2ML IJ SOLN
INTRAMUSCULAR | Status: AC
  Filled 2020-12-16: qty 2

## 2020-12-16 MED ORDER — OXYCODONE HCL 5 MG PO TABS: 5.0000 mg | ORAL_TABLET | ORAL | Status: DC | PRN

## 2020-12-16 MED ORDER — ASPIRIN EC 81 MG PO TBEC: 81.0000 mg | DELAYED_RELEASE_TABLET | Freq: Two times a day (BID) | ORAL | 0 refills | Status: AC

## 2020-12-16 MED ORDER — ACETAMINOPHEN 325 MG PO TABS: 325.0000 mg | ORAL_TABLET | Freq: Four times a day (QID) | ORAL | Status: DC | PRN

## 2020-12-16 MED ORDER — BUPIVACAINE-EPINEPHRINE (PF) 0.25% -1:200000 IJ SOLN
INTRAMUSCULAR | Status: AC
  Filled 2020-12-16: qty 30

## 2020-12-16 MED ORDER — ACETAMINOPHEN 325 MG PO TABS: 650.0000 mg | ORAL_TABLET | ORAL | 2 refills | Status: DC | PRN

## 2020-12-16 MED ORDER — ONDANSETRON HCL 4 MG PO TABS
4.0000 mg | ORAL_TABLET | Freq: Four times a day (QID) | ORAL | Status: DC | PRN
  Filled 2020-12-16: qty 1

## 2020-12-16 MED ORDER — PROPOFOL 500 MG/50ML IV EMUL
INTRAVENOUS | Status: AC
  Filled 2020-12-16: qty 50

## 2020-12-16 MED ORDER — EPHEDRINE SULFATE-NACL 50-0.9 MG/10ML-% IV SOSY
PREFILLED_SYRINGE | INTRAVENOUS | Status: DC | PRN
  Administered 2020-12-16: 5 mg via INTRAVENOUS
  Administered 2020-12-16: 10 mg via INTRAVENOUS

## 2020-12-16 MED ORDER — BUPIVACAINE LIPOSOME 1.3 % IJ SUSP
INTRAMUSCULAR | Status: AC
  Filled 2020-12-16: qty 20

## 2020-12-16 SURGICAL SUPPLY — 60 items
ATTUNE MED DOME PAT 32 KNEE (Knees) ×2 IMPLANT
ATTUNE PS FEM LT SZ 4 CEM KNEE (Femur) ×2 IMPLANT
ATTUNE PSRP INSR SZ4 6 KNEE (Insert) ×2 IMPLANT
BAG COUNTER SPONGE SURGICOUNT (BAG) ×2 IMPLANT
BAG DECANTER FOR FLEXI CONT (MISCELLANEOUS) ×2 IMPLANT
BAG ZIPLOCK 12X15 (MISCELLANEOUS) ×2 IMPLANT
BASE TIBIAL ROT PLAT SZ 3 KNEE (Knees) ×1 IMPLANT
BENZOIN TINCTURE PRP APPL 2/3 (GAUZE/BANDAGES/DRESSINGS) ×2 IMPLANT
BLADE SAGITTAL 25.0X1.19X90 (BLADE) ×2 IMPLANT
BLADE SAW SGTL 13X75X1.27 (BLADE) ×2 IMPLANT
BLADE SURG 15 STRL LF DISP TIS (BLADE) ×1 IMPLANT
BLADE SURG 15 STRL SS (BLADE) ×2
BLADE SURG SZ10 CARB STEEL (BLADE) ×4 IMPLANT
BNDG ELASTIC 6X15 VLCR STRL LF (GAUZE/BANDAGES/DRESSINGS) ×2 IMPLANT
BOWL SMART MIX CTS (DISPOSABLE) ×2 IMPLANT
CEMENT HV SMART SET (Cement) ×4 IMPLANT
CLSR STERI-STRIP ANTIMIC 1/2X4 (GAUZE/BANDAGES/DRESSINGS) ×4 IMPLANT
COVER SURGICAL LIGHT HANDLE (MISCELLANEOUS) ×2 IMPLANT
DECANTER SPIKE VIAL GLASS SM (MISCELLANEOUS) ×2 IMPLANT
DRAPE INCISE IOBAN 66X45 STRL (DRAPES) ×2 IMPLANT
DRAPE ORTHO SPLIT 77X108 STRL (DRAPES) ×2
DRAPE SURG ORHT 6 SPLT 77X108 (DRAPES) ×1 IMPLANT
DRAPE U-SHAPE 47X51 STRL (DRAPES) ×2 IMPLANT
DRESSING AQUACEL AG SP 3.5X10 (GAUZE/BANDAGES/DRESSINGS) ×1 IMPLANT
DRSG AQUACEL AG ADV 3.5X10 (GAUZE/BANDAGES/DRESSINGS) ×2 IMPLANT
DRSG AQUACEL AG SP 3.5X10 (GAUZE/BANDAGES/DRESSINGS) ×2
DURAPREP 26ML APPLICATOR (WOUND CARE) ×4 IMPLANT
ELECT REM PT RETURN 15FT ADLT (MISCELLANEOUS) ×2 IMPLANT
GLOVE SRG 8 PF TXTR STRL LF DI (GLOVE) ×2 IMPLANT
GLOVE SURG ORTHO LTX SZ8 (GLOVE) ×2 IMPLANT
GLOVE SURG POLYISO LF SZ7.5 (GLOVE) ×2 IMPLANT
GLOVE SURG SYN 7.0 (GLOVE) ×6 IMPLANT
GLOVE SURG UNDER POLY LF SZ7 (GLOVE) ×6 IMPLANT
GLOVE SURG UNDER POLY LF SZ8 (GLOVE) ×4
GOWN STRL REUS W/TWL XL LVL3 (GOWN DISPOSABLE) ×12 IMPLANT
HANDPIECE INTERPULSE COAX TIP (DISPOSABLE) ×2
HOLDER FOLEY CATH W/STRAP (MISCELLANEOUS) ×2 IMPLANT
HOOD PEEL AWAY FLYTE STAYCOOL (MISCELLANEOUS) ×2 IMPLANT
IMMOBILIZER KNEE 20 (SOFTGOODS) ×2
IMMOBILIZER KNEE 20 THIGH 36 (SOFTGOODS) ×1 IMPLANT
KIT TURNOVER KIT A (KITS) ×2 IMPLANT
MANIFOLD NEPTUNE II (INSTRUMENTS) ×2 IMPLANT
NEEDLE HYPO 22GX1.5 SAFETY (NEEDLE) ×4 IMPLANT
NS IRRIG 1000ML POUR BTL (IV SOLUTION) ×2 IMPLANT
PACK ICE MAXI GEL EZY WRAP (MISCELLANEOUS) ×2 IMPLANT
PACK TOTAL KNEE CUSTOM (KITS) ×2 IMPLANT
PROTECTOR NERVE ULNAR (MISCELLANEOUS) ×2 IMPLANT
SET HNDPC FAN SPRY TIP SCT (DISPOSABLE) ×1 IMPLANT
STRIP CLOSURE SKIN 1/2X4 (GAUZE/BANDAGES/DRESSINGS) ×2 IMPLANT
SUT ETHIBOND NAB CT1 #1 30IN (SUTURE) ×4 IMPLANT
SUT MNCRL AB 3-0 PS2 18 (SUTURE) ×2 IMPLANT
SUT VIC AB 0 CT1 36 (SUTURE) ×2 IMPLANT
SUT VIC AB 2-0 CT1 27 (SUTURE) ×4
SUT VIC AB 2-0 CT1 TAPERPNT 27 (SUTURE) ×2 IMPLANT
SYR CONTROL 10ML LL (SYRINGE) ×4 IMPLANT
TIBIAL BASE ROT PLAT SZ 3 KNEE (Knees) ×2 IMPLANT
TOWEL OR 17X26 10 PK STRL BLUE (TOWEL DISPOSABLE) ×2 IMPLANT
TRAY FOLEY MTR SLVR 14FR STAT (SET/KITS/TRAYS/PACK) ×2 IMPLANT
TRAY FOLEY MTR SLVR 16FR STAT (SET/KITS/TRAYS/PACK) IMPLANT
WATER STERILE IRR 1000ML POUR (IV SOLUTION) ×4 IMPLANT

## 2020-12-16 NOTE — Anesthesia Procedure Notes (Signed)
Procedure Name: MAC Date/Time: 12/16/2020 10:41 AM Performed by: Niel Hummer, CRNA Pre-anesthesia Checklist: Patient identified, Emergency Drugs available, Suction available and Patient being monitored Oxygen Delivery Method: Simple face mask

## 2020-12-16 NOTE — Discharge Instructions (Signed)

## 2020-12-16 NOTE — Anesthesia Preprocedure Evaluation (Signed)
Anesthesia Evaluation  Patient identified by MRN, date of birth, ID band Patient awake    Reviewed: Allergy & Precautions, NPO status , Patient's Chart, lab work & pertinent test results  Airway Mallampati: II  TM Distance: >3 FB Neck ROM: Full    Dental no notable dental hx.    Pulmonary Current Smoker and Patient abstained from smoking.,    Pulmonary exam normal breath sounds clear to auscultation       Cardiovascular negative cardio ROS Normal cardiovascular exam Rhythm:Regular Rate:Normal     Neuro/Psych negative neurological ROS  negative psych ROS   GI/Hepatic negative GI ROS, Neg liver ROS,   Endo/Other  negative endocrine ROS  Renal/GU negative Renal ROS  negative genitourinary   Musculoskeletal  (+) Arthritis , Osteoarthritis,    Abdominal   Peds negative pediatric ROS (+)  Hematology negative hematology ROS (+)   Anesthesia Other Findings   Reproductive/Obstetrics negative OB ROS                             Anesthesia Physical Anesthesia Plan  ASA: 2  Anesthesia Plan: Spinal   Post-op Pain Management:  Regional for Post-op pain   Induction: Intravenous  PONV Risk Score and Plan: 2 and Ondansetron and Dexamethasone  Airway Management Planned: Simple Face Mask  Additional Equipment:   Intra-op Plan:   Post-operative Plan:   Informed Consent: I have reviewed the patients History and Physical, chart, labs and discussed the procedure including the risks, benefits and alternatives for the proposed anesthesia with the patient or authorized representative who has indicated his/her understanding and acceptance.     Dental advisory given  Plan Discussed with: CRNA and Surgeon  Anesthesia Plan Comments:         Anesthesia Quick Evaluation

## 2020-12-16 NOTE — Progress Notes (Signed)
Orthopedic Tech Progress Note Patient Details:  Joann Watson [birth year] [id number]  CPM Left Knee CPM Left Knee: On Left Knee Flexion (Degrees): 90 Left Knee Extension (Degrees): 0    Ortho Devices Type of Ortho Device: Bone foam zero knee Ortho Device/Splint Interventions: Ordered, Application      Kirtis Challis E Jadrian Bulman 12/16/2020, 3:09 PM

## 2020-12-16 NOTE — Op Note (Signed)
NAME: Joann Watson, Joann Watson MEDICAL RECORD NO: [id number] ACCOUNT NO: [id number] DATE OF BIRTH: 11/03/1965 FACILITY: [facility] LOCATION: [facility] PHYSICIAN: W D. Carloyn Manner., MD  Operative Report   DATE OF PROCEDURE: 12/16/2020  PREOPERATIVE DIAGNOSIS:  Severe osteoarthritis, left knee.  POSTOPERATIVE DIAGNOSIS:  Severe osteoarthritis, left knee.  OPERATION:  Left total knee replacement (DePuy Attune cemented knee with size 4 femur, size 6 mm bearing with size 3 tibia and 32 mm all poly patella).  SURGEON: W D. Carloyn Manner., MD  ASSISTANTMardee Postin, spinal with block.  TOURNIQUET TIME:  53 minutes.  DESCRIPTION OF PROCEDURE:  Supine position, exsanguination of the leg, inflation of the thigh tourniquet to 350.  Straight skin incision with medial parapatellar approach to the knee made.  Moderate varus deformity with a flexion contracture of the knee  was noted.  We did a 5-degree 9 mm valgus cut on the femur followed by cutting about 3 mm below the most diseased medial compartment with an additional 2 mm to obtain full extension.  Femur was sized to be a size 4.  Placement of the all-in-1 cutting  block in the appropriate degree of external rotation, cutting the anterior and posterior chamfer cuts.  Tibia was sized to be a size 3.  Small osteophytes were removed from the posterior aspect of the knee with complete release of the PCL as well.  Keel  hole was cut for the tibial baseplate.  Trial tibia was placed.  Box cut was next done on the femur.  Trials were placed.  Full extension was noted with excellent tracking with good stability in all degrees of flexion with the 6 mm bearing.  We resected  9.5 mm of the patella, placed in the 32 mm all poly trial.  Again, all parameters were deemed to be acceptable.  Cement was inserted in the doughy state tibia followed by femur and patella.  Cement was allowed to harden with the trial in.  Trial was  removed.  Small bits of cement were removed from the  posterior aspect of the knee.  Tourniquet was released under direct vision.  No excessive bleeding was noted.  Small bleeders were coagulated and the final bearing was placed.  Closure was effected  with #1 Ethibond, 2-0 Vicryl, and Monocryl in the skin.  Taken to recovery room in stable condition.   VAI D: 12/16/2020 12:19:05 pm T: 12/16/2020 11:40:00 pm  JOB: [id number]/ [id number]

## 2020-12-16 NOTE — Addendum Note (Signed)
Addendum  created 12/16/20 1346 by Nelle Don, CRNA   Charge Capture section accepted

## 2020-12-16 NOTE — Anesthesia Procedure Notes (Signed)
Anesthesia Regional Block: Adductor canal block   Pre-Anesthetic Checklist: , timeout performed,  Correct Patient, Correct Site, Correct Laterality,  Correct Procedure, Correct Position, site marked,  Risks and benefits discussed,  Surgical consent,  Pre-op evaluation,  At surgeon's request and post-op pain management  Laterality: Left  Prep: chloraprep       Needles:  Injection technique: Single-shot  Needle Type: Echogenic Needle     Needle Length: 9cm      Additional Needles:   Procedures:,,,, ultrasound used (permanent image in chart),,    Narrative:  Start time: 12/16/2020 9:37 AM End time: 12/16/2020 9:44 AM Injection made incrementally with aspirations every 5 mL.  Performed by: Personally  Anesthesiologist: Eilene Ghazi, MD  Additional Notes: Patient tolerated the procedure well without complications

## 2020-12-16 NOTE — Transfer of Care (Signed)
Immediate Anesthesia Transfer of Care Note  Patient: Joann Watson  Procedure(s) Performed: TOTAL KNEE ARTHROPLASTY (Left: Knee)  Patient Location: PACU  Anesthesia Type:Spinal  Level of Consciousness: awake  Airway & Oxygen Therapy: Patient Spontanous Breathing and Patient connected to face mask oxygen  Post-op Assessment: Report given to RN and Post -op Vital signs reviewed and stable  Post vital signs: Reviewed and stable  Last Vitals:  Vitals Value Taken Time  BP 106/73 12/16/20 1245  Temp    Pulse 59 12/16/20 1246  Resp 12 12/16/20 1246  SpO2 100 % 12/16/20 1246  Vitals shown include unvalidated device data.  Last Pain:  Vitals:   12/16/20 0817  TempSrc:   PainSc: 0-No pain      Patients Stated Pain Goal: 3 (12/16/20 0817)  Complications: No notable events documented.

## 2020-12-16 NOTE — Progress Notes (Signed)
Assisted Dr. Rose with left, ultrasound guided, adductor canal block. Side rails up, monitors on throughout procedure. See vital signs in flow sheet. Tolerated Procedure well.  

## 2020-12-16 NOTE — Anesthesia Postprocedure Evaluation (Signed)
Anesthesia Post Note  Patient: Joann Watson  Procedure(s) Performed: TOTAL KNEE ARTHROPLASTY (Left: Knee)     Patient location during evaluation: PACU Anesthesia Type: Spinal Level of consciousness: oriented and awake and alert Pain management: pain level controlled Vital Signs Assessment: post-procedure vital signs reviewed and stable Respiratory status: spontaneous breathing, respiratory function stable and patient connected to nasal cannula oxygen Cardiovascular status: blood pressure returned to baseline and stable Postop Assessment: no headache, no backache and no apparent nausea or vomiting Anesthetic complications: no   No notable events documented.  Last Vitals:  Vitals:   12/16/20 1245 12/16/20 1300  BP: 106/73 107/71  Pulse: (!) 56 (!) 55  Resp: 12 13  Temp:    SpO2: 100% 100%    Last Pain:  Vitals:   12/16/20 1300  TempSrc:   PainSc: Asleep    LLE Motor Response: Purposeful movement (12/16/20 1300) LLE Sensation: Decreased;Numbness (12/16/20 1300) RLE Motor Response: Purposeful movement (12/16/20 1300) RLE Sensation: Decreased;Numbness (12/16/20 1300) L Sensory Level: L3-Anterior knee, lower leg (12/16/20 1300) R Sensory Level: L3-Anterior knee, lower leg (12/16/20 1300)  Shaneil Yazdi S

## 2020-12-16 NOTE — Anesthesia Procedure Notes (Signed)
Spinal  Patient location during procedure: OR Start time: 12/16/2020 10:43 AM End time: 12/16/2020 10:46 AM Reason for block: surgical anesthesia Staffing Performed: resident/CRNA  Resident/CRNA: Pulliam, Elizabeth C, CRNA Preanesthetic Checklist Completed: patient identified, IV checked, risks and benefits discussed, surgical consent, monitors and equipment checked, pre-op evaluation and timeout performed Spinal Block Patient position: sitting Prep: DuraPrep Patient monitoring: heart rate, continuous pulse ox and blood pressure Approach: midline Location: L3-4 Injection technique: single-shot Needle Needle type: Pencan  Needle gauge: 24 G Needle length: 10 cm Additional Notes Expiration date of kit checked. Clear CSF return prior to injection of local. Dr. Rose present.     

## 2020-12-16 NOTE — Brief Op Note (Signed)
12/16/2020  12:41 PM  PATIENT:  Joann Watson  55 y.o. female  PRE-OPERATIVE DIAGNOSIS:  OA LEFT KNEE  POST-OPERATIVE DIAGNOSIS:  OA LEFT KNEE  PROCEDURE:  Procedure(s): TOTAL KNEE ARTHROPLASTY (Left)  SURGEON:  Surgeon(s) and Role:    Frederico Hamman, MD - Primary  PHYSICIAN ASSISTANT: Margart Sickles, PA-C  ASSISTANTS: OR staff x1   ANESTHESIA:   local, regional, spinal, and IV sedation  EBL:  50 mL   BLOOD ADMINISTERED:none  DRAINS: none   LOCAL MEDICATIONS USED:  MARCAINE     SPECIMEN:  No Specimen  DISPOSITION OF SPECIMEN:  N/A  COUNTS:  YES  TOURNIQUET:   Total Tourniquet Time Documented: Thigh (Left) - 54 minutes Total: Thigh (Left) - 54 minutes   DICTATION: .Other Dictation: Dictation Number unknown  PLAN OF CARE: Discharge to home after PACU  PATIENT DISPOSITION:  PACU - hemodynamically stable.   Delay start of Pharmacological VTE agent (>24hrs) due to surgical blood loss or risk of bleeding: yes

## 2020-12-16 NOTE — Anesthesia Procedure Notes (Signed)
Anesthesia Procedure Image    

## 2020-12-16 NOTE — Interval H&P Note (Signed)
History and Physical Interval Note:  12/16/2020 7:38 AM  Joann Watson  has presented today for surgery, with the diagnosis of OA LEFT KNEE.  The various methods of treatment have been discussed with the patient and family. After consideration of risks, benefits and other options for treatment, the patient has consented to  Procedure(s): TOTAL KNEE ARTHROPLASTY (Left) as a surgical intervention.  The patient's history has been reviewed, patient examined, no change in status, stable for surgery.  I have reviewed the patient's chart and labs.  Questions were answered to the patient's satisfaction.     Thera Flake

## 2020-12-16 NOTE — Evaluation (Signed)
Physical Therapy Evaluation Patient Details Name: Joann Watson MRN: [id number] DOB: February 25, 1966 Today's Date: 12/16/2020  History of Present Illness  Patient is 55 y.o. female s/p Rt TKA on 12/16/20 with PMH significant for OA.  Clinical Impression  Joann Watson is a 55 y.o. female POD 0 s/p Rt TKA. Patient reports independence with mobility at baseline. Patient is now limited by functional impairments (see PT problem list below) and requires min guard/supervision for transfers and gait with RW. Patient was able to ambulate ~80 feet with RW and min guard/supervision and cues for safe walker management. Patient educated on safe sequencing for stair mobility and verbalized safe guarding position for people assisting with mobility. Patient instructed in exercises to facilitate ROM and circulation. Patient will benefit from continued skilled PT interventions to address impairments and progress towards PLOF. Patient has met mobility goals at adequate level for discharge home; will continue to follow if pt continues acute stay to progress towards Mod I goals.        Recommendations for follow up therapy are one component of a multi-disciplinary discharge planning process, led by the attending physician.  Recommendations may be updated based on patient status, additional functional criteria and insurance authorization.  Follow Up Recommendations Follow surgeon's recommendation for DC plan and follow-up therapies;Home health PT    Equipment Recommendations  Rolling walker with 5" wheels    Recommendations for Other Services       Precautions / Restrictions Precautions Precautions: Fall Restrictions Weight Bearing Restrictions: No Other Position/Activity Restrictions: WBAT      Mobility  Bed Mobility Overal bed mobility: Needs Assistance Bed Mobility: Supine to Sit     Supine to sit: Min guard;Supervision;HOB elevated     General bed mobility comments: HOB slightly elevated, no cues  or assist needed    Transfers Overall transfer level: Needs assistance Equipment used: Rolling walker (2 wheeled) Transfers: Sit to/from Stand Sit to Stand: Min guard;Supervision         General transfer comment: guarding/supervision for safety with rise from EOB and toilet. no assist needed for power up.  Ambulation/Gait Ambulation/Gait assistance: Min guard;Supervision Gait Distance (Feet): 80 Feet Assistive device: Rolling walker (2 wheeled) Gait Pattern/deviations: Step-to pattern;Decreased stride length;Decreased stance time - left Gait velocity: decr   General Gait Details: cues for safe step pattern and proximity to RW, no overt LOB or buckling at Lt knee in stance. pt progressed from min guard to supervision level with gait.  Stairs Stairs: Yes Stairs assistance: Min guard;Supervision Stair Management: No rails;Step to pattern;Forwards;With walker Number of Stairs: 2 General stair comments: cues for sequencing "up with good, down with bad" and for safe walker management. pt verbalized safe understanding for famly guarding with stairs.  Wheelchair Mobility    Modified Rankin (Stroke Patients Only)       Balance Overall balance assessment: Needs assistance Sitting-balance support: Feet supported Sitting balance-Leahy Scale: Good     Standing balance support: During functional activity;Bilateral upper extremity supported Standing balance-Leahy Scale: Fair                               Pertinent Vitals/Pain Pain Assessment: 0-10 Pain Score: 7  Pain Location: Lt knee Pain Descriptors / Indicators: Aching Pain Intervention(s): Limited activity within patient's tolerance;Monitored during session;Repositioned    Home Living Family/patient expects to be discharged to:: Private residence Living Arrangements: Spouse/significant other Available Help at Discharge: Family Type of Home: House  Home Access: Level entry     Home Layout: One level Home  Equipment: Walker - 2 wheels      Prior Function Level of Independence: Independent         Comments: pt is NT at [facility] on 4th floor     Hand Dominance   Dominant Hand: Right    Extremity/Trunk Assessment   Upper Extremity Assessment Upper Extremity Assessment: Overall WFL for tasks assessed    Lower Extremity Assessment Lower Extremity Assessment: Overall WFL for tasks assessed;LLE deficits/detail LLE Deficits / Details: good quad activation, no extensor lag with SLR LLE Sensation: WNL LLE Coordination: WNL    Cervical / Trunk Assessment Cervical / Trunk Assessment: Normal  Communication   Communication: No difficulties  Cognition Arousal/Alertness: Awake/alert Behavior During Therapy: WFL for tasks assessed/performed Overall Cognitive Status: Within Functional Limits for tasks assessed                                        General Comments      Exercises Total Joint Exercises Ankle Circles/Pumps: AROM;Both;10 reps;Seated Quad Sets: AROM;Both;5 reps;Seated Short Arc Quad: AROM;Left;Other reps (comment);Seated Heel Slides: AROM;Left;Other reps (comment);Seated Hip ABduction/ADduction: AROM;Left;Other reps (comment);Seated Straight Leg Raises: AROM;Left;Other reps (comment);Seated   Assessment/Plan    PT Assessment Patient needs continued PT services  PT Problem List Decreased strength;Decreased range of motion;Decreased activity tolerance;Decreased balance;Decreased mobility;Decreased knowledge of use of DME;Decreased knowledge of precautions       PT Treatment Interventions DME instruction;Gait training;Stair training;Functional mobility training;Therapeutic activities;Therapeutic exercise;Balance training;Patient/family education    PT Goals (Current goals can be found in the Care Plan section)  Acute Rehab PT Goals Patient Stated Goal: get home today PT Goal Formulation: With patient Time For Goal Achievement: 12/23/20 Potential to  Achieve Goals: Good    Frequency 7X/week   Barriers to discharge        Co-evaluation               AM-PAC PT "6 Clicks" Mobility  Outcome Measure Help needed turning from your back to your side while in a flat bed without using bedrails?: None Help needed moving from lying on your back to sitting on the side of a flat bed without using bedrails?: A Little Help needed moving to and from a bed to a chair (including a wheelchair)?: A Little Help needed standing up from a chair using your arms (e.g., wheelchair or bedside chair)?: A Little Help needed to walk in hospital room?: A Little Help needed climbing 3-5 steps with a railing? : A Little 6 Click Score: 19    End of Session Equipment Utilized During Treatment: Gait belt Activity Tolerance: Patient tolerated treatment well Patient left: in chair;with call bell/phone within reach Nurse Communication: Mobility status PT Visit Diagnosis: Muscle weakness (generalized) (M62.81);Difficulty in walking, not elsewhere classified (R26.2)    Time: 4932-4199 PT Time Calculation (min) (ACUTE ONLY): 31 min   Charges:   PT Evaluation $PT Eval Low Complexity: 1 Low PT Treatments $Gait Training: 8-22 mins        Verner Mould, DPT Acute Rehabilitation Services Office [phone] Pager [phone]   Jacques Navy 12/16/2020, 6:10 PM

## 2020-12-19 ENCOUNTER — Encounter (HOSPITAL_COMMUNITY): Payer: Self-pay | Admitting: Orthopedic Surgery

## 2021-07-17 ENCOUNTER — Other Ambulatory Visit (HOSPITAL_COMMUNITY): Payer: No Typology Code available for payment source

## 2021-07-18 NOTE — Patient Instructions (Signed)
DUE TO COVID-19 ONLY TWO VISITORS  (aged 56 and older)  ARE ALLOWED TO COME WITH YOU AND STAY IN THE WAITING ROOM ONLY DURING PRE OP AND PROCEDURE.   ?**NO VISITORS ARE ALLOWED IN THE SHORT STAY AREA OR RECOVERY ROOM!!** ? ?IF YOU WILL BE ADMITTED INTO THE HOSPITAL YOU ARE ALLOWED ONLY FOUR SUPPORT PEOPLE DURING VISITATION HOURS ONLY (7 AM -8PM)   ?The support person(s) must pass our screening, gel in and out, and wear a mask at all times, including in the patient?s room. ?Patients must also wear a mask when staff or their support person are in the room. ?Visitors GUEST BADGE MUST BE WORN VISIBLY  ?One adult visitor may remain with you overnight and MUST be in the room by 8 P.M. ?  ? ? Your procedure is scheduled on: 08/04/21 ? ? Report to [facility] Main Entrance ? ?  Report to short stay at 5:15 AM ? ? Call this number if you have problems the morning of surgery [phone] ? ? Do not eat food :After Midnight. ? ? After Midnight you may have the following liquids until _4:30_ AM/  DAY OF SURGERY ? ?Water ?Black Coffee (sugar ok, NO MILK/CREAM OR CREAMERS)  ?Tea (sugar ok, NO MILK/CREAM OR CREAMERS) regular and decaf                             ?Plain Jell-O (NO RED)                                           ?Fruit ices (not with fruit pulp, NO RED)                                     ?Popsicles (NO RED)                                                                  ?Juice: apple, WHITE grape, WHITE cranberry ?Sports drinks like Gatorade (NO RED) ?Clear broth(vegetable,chicken,beef) ? ?             ? ?  ?  ?The day of surgery:  ?Drink ONE (1) Pre-Surgery Clear Ensure at 4:15 AM the morning of surgery. Drink in one sitting. Do not sip.  ?This drink was given to you during your hospital  ?pre-op appointment visit. ?Nothing else to drink after completing the  ?Pre-Surgery Clear Ensure at 4:30 AM ?  ?       If you have questions, please contact your surgeon?s office. ? ? ?  ?  ?Oral Hygiene is also  important to reduce your risk of infection.                                    ?Remember - BRUSH YOUR TEETH THE MORNING OF SURGERY WITH YOUR REGULAR TOOTHPASTE ? ? Do NOT smoke after Midnight ? ? Take these medicines the morning of surgery with A SIP OF WATER: none ? ? ?  Bring CPAP mask and tubing day of surgery. ?                  ?           You may not have any metal on your body including hair pins, jewelry, and body piercing ? ?           Do not wear make-up, lotions, powders, perfumes/cologne, or deodorant ? ?Do not wear nail polish including gel and S&S, artificial/acrylic nails, or any other type of covering on natural nails including finger and toenails. If you have artificial nails, gel coating, etc. that needs to be removed by a nail salon please have this removed prior to surgery or surgery may need to be canceled/ delayed if the surgeon/ anesthesia feels like they are unable to be safely monitored.  ? ?Do not shave  48 hours prior to surgery.  ? ?         ? ? Do not bring valuables to the hospital. [facility] IS NOT ?            RESPONSIBLE   FOR VALUABLES. ? ? Contacts, dentures or bridgework may not be worn into surgery. ? ?  ? Patients discharged on the day of surgery will not be allowed to drive home.  Someone NEEDS to stay with you for the first 24 hours after anesthesia. ? ? Special Instructions: Bring a copy of your healthcare power of attorney and living will documents the day of surgery if you haven't scanned them before. ? ?            Please read over the following fact sheets you were given: IF YOU HAVE QUESTIONS ABOUT YOUR PRE-OP INSTRUCTIONS PLEASE CALL [phone] ? ?   [place] - Preparing for Surgery ?Before surgery, you can play an important role.  Because skin is not sterile, your skin needs to be as free of germs as possible.  You can reduce the number of germs on your skin by washing with CHG (chlorahexidine gluconate) soap before surgery.  CHG is an antiseptic cleaner which  kills germs and bonds with the skin to continue killing germs even after washing. ?Please DO NOT use if you have an allergy to CHG or antibacterial soaps.  If your skin becomes reddened/irritated stop using the CHG and inform your nurse when you arrive at Short Stay. ?Do not shave (including legs and underarms) for at least 48 hours prior to the first CHG shower.  ?Please follow these instructions carefully: ? 1.  Shower with CHG Soap the night before surgery and the  morning of Surgery. ? 2.  If you choose to wash your hair, wash your hair first as usual with your  normal  shampoo. ? 3.  After you shampoo, rinse your hair and body thoroughly to remove the  shampoo.                     ?       4.  Use CHG as you would any other liquid soap.  You can apply chg directly  to the skin and wash  ?                     Gently with a scrungie or clean washcloth. ? 5.  Apply the CHG Soap to your body ONLY FROM THE NECK DOWN.   Do not use on face/ open      ?  Wound or open sores. Avoid contact with eyes, ears mouth and genitals (private parts).  ?                     Production manager,  Genitals (private parts) with your normal soap. ?            6.  Wash thoroughly, paying special attention to the area where your surgery  will be performed. ? 7.  Thoroughly rinse your body with warm water from the neck down. ? 8.  DO NOT shower/wash with your normal soap after using and rinsing off  the CHG Soap. ?               9.  Pat yourself dry with a clean towel. ?           10.  Wear clean pajamas. ?           11.  Place clean sheets on your bed the night of your first shower and do not  sleep with pets. ?Day of Surgery : ?Do not apply any lotions/deodorants the morning of surgery.  Please wear clean clothes to the hospital/surgery center. ? ?FAILURE TO FOLLOW THESE INSTRUCTIONS MAY RESULT IN THE CANCELLATION OF YOUR SURGERY ?PATIENT SIGNATURE_________________________________ ? ?NURSE  SIGNATURE__________________________________ ? ?________________________________________________________________________  ? ?Incentive Spirometer ? ?An incentive spirometer is a tool that can help keep your lungs clear and active. This tool measures how well you are filling your lungs with each breath. Taking long deep breaths may help reverse or decrease the chance of developing breathing (pulmonary) problems (especially infection) following: ?A long period of time when you are unable to move or be active. ?BEFORE THE PROCEDURE  ?If the spirometer includes an indicator to show your best effort, your nurse or respiratory therapist will set it to a desired goal. ?If possible, sit up straight or lean slightly forward. Try not to slouch. ?Hold the incentive spirometer in an upright position. ?INSTRUCTIONS FOR USE  ?Sit on the edge of your bed if possible, or sit up as far as you can in bed or on a chair. ?Hold the incentive spirometer in an upright position. ?Breathe out normally. ?Place the mouthpiece in your mouth and seal your lips tightly around it. ?Breathe in slowly and as deeply as possible, raising the piston or the ball toward the top of the column. ?Hold your breath for 3-5 seconds or for as long as possible. Allow the piston or ball to fall to the bottom of the column. ?Remove the mouthpiece from your mouth and breathe out normally. ?Rest for a few seconds and repeat Steps 1 through 7 at least 10 times every 1-2 hours when you are awake. Take your time and take a few normal breaths between deep breaths. ?The spirometer may include an indicator to show your best effort. Use the indicator as a goal to work toward during each repetition. ?After each set of 10 deep breaths, practice coughing to be sure your lungs are clear. If you have an incision (the cut made at the time of surgery), support your incision when coughing by placing a pillow or rolled up towels firmly against it. ?Once you are able to get out of  bed, walk around indoors and cough well. You may stop using the incentive spirometer when instructed by your caregiver.  ?RISKS AND COMPLICATIONS ?Take your time so you do not get dizzy or light-headed. ?If you are in pain, you may need to take or  as

## 2021-07-18 NOTE — Progress Notes (Signed)
Sent message, via epic in basket, requesting orders in epic from surgeon.  

## 2021-07-24 ENCOUNTER — Encounter (HOSPITAL_COMMUNITY)
Admission: RE | Admit: 2021-07-24 | Discharge: 2021-07-24 | Disposition: A | Discharge status: Home / Self Care | Payer: No Typology Code available for payment source | Source: Ambulatory Visit | Attending: Orthopedic Surgery | Admitting: Orthopedic Surgery

## 2021-07-24 ENCOUNTER — Other Ambulatory Visit: Payer: Self-pay

## 2021-07-24 ENCOUNTER — Encounter (HOSPITAL_COMMUNITY): Payer: Self-pay

## 2021-07-24 ENCOUNTER — Ambulatory Visit: Payer: Self-pay | Admitting: Physician Assistant

## 2021-07-24 DIAGNOSIS — Z01818 Encounter for other preprocedural examination: Secondary | ICD-10-CM

## 2021-07-24 DIAGNOSIS — Z01812 Encounter for preprocedural laboratory examination: Secondary | ICD-10-CM | POA: Insufficient documentation

## 2021-07-24 LAB — CBC
HCT: 41.2 % (ref 36.0–46.0)
Hemoglobin: 14.2 g/dL (ref 12.0–15.0)
MCH: 31.6 pg (ref 26.0–34.0)
MCHC: 34.5 g/dL (ref 30.0–36.0)
MCV: 91.6 fL (ref 80.0–100.0)
Platelets: 198 10*3/uL (ref 150–400)
RBC: 4.5 MIL/uL (ref 3.87–5.11)
RDW: 12.9 % (ref 11.5–15.5)
WBC: 3.8 10*3/uL — ABNORMAL LOW (ref 4.0–10.5)
nRBC: 0 % (ref 0.0–0.2)

## 2021-07-24 LAB — BASIC METABOLIC PANEL
Anion gap: 6 (ref 5–15)
BUN: 17 mg/dL (ref 6–20)
CO2: 27 mmol/L (ref 22–32)
Calcium: 8.8 mg/dL — ABNORMAL LOW (ref 8.9–10.3)
Chloride: 105 mmol/L (ref 98–111)
Creatinine, Ser: 0.69 mg/dL (ref 0.44–1.00)
GFR, Estimated: >60 mL/min (ref >60)
Glucose, Bld: 103 mg/dL — ABNORMAL HIGH (ref 70–99)
Potassium: 4.4 mmol/L (ref 3.5–5.1)
Sodium: 138 mmol/L (ref 135–145)

## 2021-07-24 LAB — SURGICAL PCR SCREEN
MRSA, PCR: NEGATIVE
Staphylococcus aureus: POSITIVE — AB

## 2021-07-24 NOTE — H&P (View-Only) (Signed)
TOTAL KNEE ADMISSION H&P  Patient is being admitted for right total knee arthroplasty.  Subjective:  Chief Complaint:right knee pain.  HPI: Joann Watson, 56 y.o. female, has a history of pain and functional disability in the right knee due to arthritis and has failed non-surgical conservative treatments for greater than 12 weeks to includeNSAID's and/or analgesics, corticosteriod injections, viscosupplementation injections, and activity modification.  Onset of symptoms was gradual, starting 1 years ago with gradually worsening course since that time. The patient noted no past surgery on the right knee(s).  Patient currently rates pain in the right knee(s) at 8 out of 10 with activity. Patient has night pain, worsening of pain with activity and weight bearing, pain that interferes with activities of daily living, pain with passive range of motion, crepitus, and joint swelling.  Patient has evidence of periarticular osteophytes and joint space narrowing by imaging studies. There is no active infection.  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 (Patient not taking: Reported on 07/18/2021) 100 tablet 2    docusate sodium (COLACE) 100 MG capsule Take 1 capsule  (100 mg total) by mouth daily as needed. (Patient not taking: Reported on 07/18/2021) 30 capsule 2    Multiple Vitamin (MULTIVITAMIN WITH MINERALS) TABS tablet Take 1 tablet by mouth daily.      oxyCODONE (OXY IR/ROXICODONE) 5 MG immediate release tablet Take one tab po q4-6hrs prn pain (Patient not taking: Reported on 07/18/2021) 40 tablet 0    No current facility-administered medications for this visit.   No Known Allergies  Social History   Tobacco Use   Smoking status: Every Day    Packs/day: 0.25    Years: 20.00    Pack years: 5.00    Types: Cigarettes   Smokeless tobacco: Never   Tobacco comments:    Trying to quit smoking   Substance Use Topics   Alcohol use: Never    No family history on file.   Review of Systems  Musculoskeletal:  Positive for arthralgias.  All other systems reviewed and are negative.  Objective:  Physical Exam Constitutional:      General: She is not in acute distress.    Appearance: Normal appearance.  HENT:     Head: Normocephalic and atraumatic.  Eyes:     Extraocular Movements: Extraocular movements intact.     Pupils: Pupils are equal, round, and reactive to light.  Cardiovascular:     Rate and Rhythm: Normal rate and regular rhythm.     Pulses: Normal pulses.     Heart sounds: Normal heart sounds.  Pulmonary:     Effort: Pulmonary effort is normal. No respiratory distress.     Breath sounds: Normal breath sounds. No wheezing.  Abdominal:  General: Abdomen is flat. Bowel sounds are normal. There is no distension.     Palpations: Abdomen is soft.     Tenderness: There is no abdominal tenderness.  Musculoskeletal:     Cervical back: Normal range of motion and neck supple.     Comments: Examination of the right lower extremity shows again she is neurovascularly intact.  Intact dorsiflexion and plantarflexion with the ankle.  There is no calf tenderness to palpation.  Good flexion and extension with the knee.  She does have medial joint  line tenderness.  Mild swelling.  No significant effusion.  No warmth or erythema.    Lymphadenopathy:     Cervical: No cervical adenopathy.  Skin:    General: Skin is warm and dry.     Findings: No erythema or rash.  Neurological:     General: No focal deficit present.     Mental Status: She is alert and oriented to person, place, and time.  Psychiatric:        Mood and Affect: Mood normal.        Behavior: Behavior normal.    Vital signs in last 24 hours: @VSRANGES @  Labs:   Estimated body mass index is 30.23 kg/m as calculated from the following:   Height as of an earlier encounter on 07/24/21: 5\' 1"  (1.549 m).   Weight as of an earlier encounter on 07/24/21: 72.6 kg.   Imaging Review Plain radiographs demonstrate moderate degenerative joint disease of the right knee(s). The overall alignment ismild varus. The bone quality appears to be good for age and reported activity level.      Assessment/Plan:  End stage arthritis, right knee   The patient history, physical examination, clinical judgment of the provider and imaging studies are consistent with end stage degenerative joint disease of the right knee(s) and total knee arthroplasty is deemed medically necessary. The treatment options including medical management, injection therapy arthroscopy and arthroplasty were discussed at length. The risks and benefits of total knee arthroplasty were presented and reviewed. The risks due to aseptic loosening, infection, stiffness, patella tracking problems, thromboembolic complications and other imponderables were discussed. The patient acknowledged the explanation, agreed to proceed with the plan and consent was signed. Patient is being admitted for inpatient treatment for surgery, pain control, PT, OT, prophylactic antibiotics, VTE prophylaxis, progressive ambulation and ADL's and discharge planning. The patient is planning to be discharged  home with outpt PT.     Patient's  anticipated LOS is less than 2 midnights, meeting these requirements: - Younger than 23 - Lives within 1 hour of care - Has a competent adult at home to recover with post-op recover - NO history of  - Chronic pain requiring opiods  - Diabetes  - Coronary Artery Disease  - Heart failure  - Heart attack  - Stroke  - DVT/VTE  - Cardiac arrhythmia  - Respiratory Failure/COPD  - Renal failure  - Anemia  - Advanced Liver disease

## 2021-07-24 NOTE — Progress Notes (Signed)
Anesthesia note: ? ?Bowel prep reminder:NA ? ?PCP - Dr. Lowella Bandy ?Cardiologist -none ?Other-  ? ?Chest x-ray - no ?EKG - no ?Stress Test - no ?ECHO - no ?Cardiac Cath - np ? ?Pacemaker/ICD device last checked:NA ? ?Sleep Study - no ?CPAP -  ? ?Pt is pre diabetic-NA ?Fasting Blood Sugar -  ?Checks Blood Sugar _____ ? ?Blood Thinner:NA ?Blood Thinner Instructions: ?Aspirin Instructions: ?Last Dose: ? ?Anesthesia review: no ? ?Patient denies shortness of breath, fever, cough and chest pain at PAT appointment ?Pt has no SOB with any activities ? ?Patient verbalized understanding of instructions that were given to them at the PAT appointment. Patient was also instructed that they will need to review over the PAT instructions again at home before surgery. yes ?

## 2021-07-24 NOTE — H&P (Signed)
TOTAL KNEE ADMISSION H&P  Patient is being admitted for right total knee arthroplasty.  Subjective:  Chief Complaint:right knee pain.  HPI: Joann Watson, 56 y.o. female, has a history of pain and functional disability in the right knee due to arthritis and has failed non-surgical conservative treatments for greater than 12 weeks to includeNSAID's and/or analgesics, corticosteriod injections, viscosupplementation injections, and activity modification.  Onset of symptoms was gradual, starting 1 years ago with gradually worsening course since that time. The patient noted no past surgery on the right knee(s).  Patient currently rates pain in the right knee(s) at 8 out of 10 with activity. Patient has night pain, worsening of pain with activity and weight bearing, pain that interferes with activities of daily living, pain with passive range of motion, crepitus, and joint swelling.  Patient has evidence of periarticular osteophytes and joint space narrowing by imaging studies. There is no active infection.  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 (Patient not taking: Reported on 07/18/2021) 100 tablet 2    docusate sodium (COLACE) 100 MG capsule Take 1 capsule  (100 mg total) by mouth daily as needed. (Patient not taking: Reported on 07/18/2021) 30 capsule 2    Multiple Vitamin (MULTIVITAMIN WITH MINERALS) TABS tablet Take 1 tablet by mouth daily.      oxyCODONE (OXY IR/ROXICODONE) 5 MG immediate release tablet Take one tab po q4-6hrs prn pain (Patient not taking: Reported on 07/18/2021) 40 tablet 0    No current facility-administered medications for this visit.   No Known Allergies  Social History   Tobacco Use   Smoking status: Every Day    Packs/day: 0.25    Years: 20.00    Pack years: 5.00    Types: Cigarettes   Smokeless tobacco: Never   Tobacco comments:    Trying to quit smoking   Substance Use Topics   Alcohol use: Never    No family history on file.   Review of Systems  Musculoskeletal:  Positive for arthralgias.  All other systems reviewed and are negative.  Objective:  Physical Exam Constitutional:      General: She is not in acute distress.    Appearance: Normal appearance.  HENT:     Head: Normocephalic and atraumatic.  Eyes:     Extraocular Movements: Extraocular movements intact.     Pupils: Pupils are equal, round, and reactive to light.  Cardiovascular:     Rate and Rhythm: Normal rate and regular rhythm.     Pulses: Normal pulses.     Heart sounds: Normal heart sounds.  Pulmonary:     Effort: Pulmonary effort is normal. No respiratory distress.     Breath sounds: Normal breath sounds. No wheezing.  Abdominal:  General: Abdomen is flat. Bowel sounds are normal. There is no distension.     Palpations: Abdomen is soft.     Tenderness: There is no abdominal tenderness.  Musculoskeletal:     Cervical back: Normal range of motion and neck supple.     Comments: Examination of the right lower extremity shows again she is neurovascularly intact.  Intact dorsiflexion and plantarflexion with the ankle.  There is no calf tenderness to palpation.  Good flexion and extension with the knee.  She does have medial joint  line tenderness.  Mild swelling.  No significant effusion.  No warmth or erythema.    Lymphadenopathy:     Cervical: No cervical adenopathy.  Skin:    General: Skin is warm and dry.     Findings: No erythema or rash.  Neurological:     General: No focal deficit present.     Mental Status: She is alert and oriented to person, place, and time.  Psychiatric:        Mood and Affect: Mood normal.        Behavior: Behavior normal.    Vital signs in last 24 hours: @VSRANGES @  Labs:   Estimated body mass index is 30.23 kg/m as calculated from the following:   Height as of an earlier encounter on 07/24/21: 5\' 1"  (1.549 m).   Weight as of an earlier encounter on 07/24/21: 72.6 kg.   Imaging Review Plain radiographs demonstrate moderate degenerative joint disease of the right knee(s). The overall alignment ismild varus. The bone quality appears to be good for age and reported activity level.      Assessment/Plan:  End stage arthritis, right knee   The patient history, physical examination, clinical judgment of the provider and imaging studies are consistent with end stage degenerative joint disease of the right knee(s) and total knee arthroplasty is deemed medically necessary. The treatment options including medical management, injection therapy arthroscopy and arthroplasty were discussed at length. The risks and benefits of total knee arthroplasty were presented and reviewed. The risks due to aseptic loosening, infection, stiffness, patella tracking problems, thromboembolic complications and other imponderables were discussed. The patient acknowledged the explanation, agreed to proceed with the plan and consent was signed. Patient is being admitted for inpatient treatment for surgery, pain control, PT, OT, prophylactic antibiotics, VTE prophylaxis, progressive ambulation and ADL's and discharge planning. The patient is planning to be discharged  home with outpt PT.     Patient's  anticipated LOS is less than 2 midnights, meeting these requirements: - Younger than 23 - Lives within 1 hour of care - Has a competent adult at home to recover with post-op recover - NO history of  - Chronic pain requiring opiods  - Diabetes  - Coronary Artery Disease  - Heart failure  - Heart attack  - Stroke  - DVT/VTE  - Cardiac arrhythmia  - Respiratory Failure/COPD  - Renal failure  - Anemia  - Advanced Liver disease

## 2021-08-03 MED ORDER — SODIUM CHLORIDE 0.9 % IV SOLN
2000.0000 mg | INTRAVENOUS | Status: DC
  Filled 2021-08-03: qty 20

## 2021-08-03 NOTE — Anesthesia Preprocedure Evaluation (Addendum)
Anesthesia Evaluation  Patient identified by MRN, date of birth, ID band Patient awake    Reviewed: Allergy & Precautions, NPO status , Patient's Chart, lab work & pertinent test results  Airway Mallampati: II  TM Distance: >3 FB Neck ROM: Full    Dental  (+) Dental Advisory Given, Missing   Pulmonary Current Smoker and Patient abstained from smoking.,    Pulmonary exam normal breath sounds clear to auscultation       Cardiovascular negative cardio ROS Normal cardiovascular exam Rhythm:Regular Rate:Normal     Neuro/Psych negative neurological ROS  negative psych ROS   GI/Hepatic negative GI ROS, Neg liver ROS,   Endo/Other  negative endocrine ROS  Renal/GU negative Renal ROS     Musculoskeletal  (+) Arthritis , Osteoarthritis,    Abdominal   Peds  Hematology negative hematology ROS (+)   Anesthesia Other Findings   Reproductive/Obstetrics negative OB ROS                            Anesthesia Physical  Anesthesia Plan  ASA: 2  Anesthesia Plan: Spinal   Post-op Pain Management:  Regional for Post-op pain and Regional block*, Tylenol PO (pre-op)*, Celebrex PO (pre-op)* and Gabapentin PO (pre-op)*   Induction: Intravenous  PONV Risk Score and Plan: 3 and Ondansetron, Dexamethasone, Propofol infusion, TIVA, Treatment may vary due to age or medical condition and Midazolam  Airway Management Planned: Simple Face Mask  Additional Equipment:   Intra-op Plan:   Post-operative Plan:   Informed Consent: I have reviewed the patients History and Physical, chart, labs and discussed the procedure including the risks, benefits and alternatives for the proposed anesthesia with the patient or authorized representative who has indicated his/her understanding and acceptance.     Dental advisory given  Plan Discussed with: CRNA  Anesthesia Plan Comments:        Anesthesia Quick  Evaluation

## 2021-08-04 ENCOUNTER — Encounter (HOSPITAL_COMMUNITY)
Admission: RE | Disposition: A | Discharge status: Home / Self Care | Payer: Self-pay | Source: Home / Self Care | Attending: Orthopedic Surgery

## 2021-08-04 ENCOUNTER — Ambulatory Visit (HOSPITAL_BASED_OUTPATIENT_CLINIC_OR_DEPARTMENT_OTHER): Payer: No Typology Code available for payment source | Admitting: Anesthesiology

## 2021-08-04 ENCOUNTER — Encounter (HOSPITAL_COMMUNITY): Payer: Self-pay | Admitting: Orthopedic Surgery

## 2021-08-04 ENCOUNTER — Ambulatory Visit (HOSPITAL_COMMUNITY): Payer: No Typology Code available for payment source | Admitting: Anesthesiology

## 2021-08-04 ENCOUNTER — Other Ambulatory Visit: Payer: Self-pay

## 2021-08-04 ENCOUNTER — Ambulatory Visit (HOSPITAL_COMMUNITY)
Admission: RE | Admit: 2021-08-04 | Discharge: 2021-08-04 | Disposition: A | Discharge status: Home / Self Care | Payer: No Typology Code available for payment source | Attending: Orthopedic Surgery | Admitting: Orthopedic Surgery

## 2021-08-04 DIAGNOSIS — M1711 Unilateral primary osteoarthritis, right knee: Secondary | ICD-10-CM | POA: Diagnosis not present

## 2021-08-04 DIAGNOSIS — F17210 Nicotine dependence, cigarettes, uncomplicated: Secondary | ICD-10-CM | POA: Insufficient documentation

## 2021-08-04 DIAGNOSIS — Z01818 Encounter for other preprocedural examination: Secondary | ICD-10-CM

## 2021-08-04 HISTORY — PX: TOTAL KNEE ARTHROPLASTY: SHX125

## 2021-08-04 SURGERY — ARTHROPLASTY, KNEE, TOTAL
Anesthesia: Spinal | Site: Knee | Laterality: Right

## 2021-08-04 MED ORDER — ASPIRIN 81 MG PO TBEC: 81.0000 mg | DELAYED_RELEASE_TABLET | Freq: Two times a day (BID) | ORAL | 0 refills | Status: AC

## 2021-08-04 MED ORDER — ACETAMINOPHEN 500 MG PO TABS
1000.0000 mg | ORAL_TABLET | Freq: Once | ORAL | Status: AC
  Administered 2021-08-04: 1000 mg via ORAL
  Filled 2021-08-04: qty 2

## 2021-08-04 MED ORDER — PHENYLEPHRINE HCL (PRESSORS) 10 MG/ML IV SOLN
INTRAVENOUS | Status: DC | PRN
  Administered 2021-08-04 (×4): 100 ug via INTRAVENOUS

## 2021-08-04 MED ORDER — ACETAMINOPHEN 500 MG PO TABS: 1000.0000 mg | ORAL_TABLET | Freq: Once | ORAL | Status: DC

## 2021-08-04 MED ORDER — PHENYLEPHRINE HCL-NACL 20-0.9 MG/250ML-% IV SOLN
INTRAVENOUS | Status: DC | PRN
  Administered 2021-08-04: 50 ug/min via INTRAVENOUS

## 2021-08-04 MED ORDER — CLONIDINE HCL (ANALGESIA) 100 MCG/ML EP SOLN
EPIDURAL | Status: DC | PRN
  Administered 2021-08-04: 80 ug

## 2021-08-04 MED ORDER — LACTATED RINGERS IV SOLN
INTRAVENOUS | Status: DC
  Administered 2021-08-04: 1000 mL via INTRAVENOUS

## 2021-08-04 MED ORDER — TRANEXAMIC ACID 1000 MG/10ML IV SOLN
INTRAVENOUS | Status: DC | PRN
  Administered 2021-08-04: 2000 mg via TOPICAL

## 2021-08-04 MED ORDER — SODIUM CHLORIDE (PF) 0.9 % IJ SOLN
INTRAMUSCULAR | Status: AC
  Filled 2021-08-04: qty 50

## 2021-08-04 MED ORDER — METHOCARBAMOL 500 MG PO TABS
500.0000 mg | ORAL_TABLET | Freq: Four times a day (QID) | ORAL | Status: DC | PRN
  Administered 2021-08-04: 500 mg via ORAL

## 2021-08-04 MED ORDER — SODIUM CHLORIDE 0.9 % IV SOLN
INTRAVENOUS | Status: DC | PRN
  Administered 2021-08-04: 50 mL via INTRAMUSCULAR

## 2021-08-04 MED ORDER — CELECOXIB 200 MG PO CAPS
200.0000 mg | ORAL_CAPSULE | Freq: Once | ORAL | Status: AC
  Administered 2021-08-04: 200 mg via ORAL
  Filled 2021-08-04: qty 1

## 2021-08-04 MED ORDER — BUPIVACAINE LIPOSOME 1.3 % IJ SUSP
INTRAMUSCULAR | Status: AC
  Filled 2021-08-04: qty 20

## 2021-08-04 MED ORDER — MIDAZOLAM HCL 2 MG/2ML IJ SOLN
INTRAMUSCULAR | Status: AC
  Filled 2021-08-04: qty 2

## 2021-08-04 MED ORDER — VANCOMYCIN HCL IN DEXTROSE 1-5 GM/200ML-% IV SOLN: 1000.0000 mg | Freq: Two times a day (BID) | INTRAVENOUS | Status: DC

## 2021-08-04 MED ORDER — LACTATED RINGERS IV BOLUS
500.0000 mL | Freq: Once | INTRAVENOUS | Status: AC
  Administered 2021-08-04: 500 mL via INTRAVENOUS

## 2021-08-04 MED ORDER — DEXAMETHASONE SODIUM PHOSPHATE 10 MG/ML IJ SOLN
INTRAMUSCULAR | Status: AC
  Filled 2021-08-04: qty 1

## 2021-08-04 MED ORDER — DEXAMETHASONE SODIUM PHOSPHATE 10 MG/ML IJ SOLN
INTRAMUSCULAR | Status: DC | PRN
  Administered 2021-08-04: 8 mg via INTRAVENOUS

## 2021-08-04 MED ORDER — DIPHENHYDRAMINE HCL 50 MG/ML IJ SOLN
INTRAMUSCULAR | Status: AC
  Filled 2021-08-04: qty 1

## 2021-08-04 MED ORDER — ORAL CARE MOUTH RINSE: 15.0000 mL | Freq: Once | OROMUCOSAL | Status: AC

## 2021-08-04 MED ORDER — ONDANSETRON HCL 4 MG/2ML IJ SOLN
INTRAMUSCULAR | Status: AC
  Filled 2021-08-04: qty 2

## 2021-08-04 MED ORDER — BUPIVACAINE-EPINEPHRINE (PF) 0.25% -1:200000 IJ SOLN
INTRAMUSCULAR | Status: AC
  Filled 2021-08-04: qty 30

## 2021-08-04 MED ORDER — SODIUM CHLORIDE 0.9 % IR SOLN
Status: DC | PRN
  Administered 2021-08-04 (×2): 1000 mL

## 2021-08-04 MED ORDER — DIPHENHYDRAMINE HCL 25 MG PO TABS: 25.0000 mg | ORAL_TABLET | Freq: Four times a day (QID) | ORAL | 0 refills | Status: AC | PRN

## 2021-08-04 MED ORDER — LACTATED RINGERS IV BOLUS
250.0000 mL | Freq: Once | INTRAVENOUS | Status: AC
  Administered 2021-08-04: 250 mL via INTRAVENOUS

## 2021-08-04 MED ORDER — SODIUM CHLORIDE 0.9 % IV SOLN: INTRAVENOUS | Status: DC

## 2021-08-04 MED ORDER — GABAPENTIN 300 MG PO CAPS
300.0000 mg | ORAL_CAPSULE | Freq: Once | ORAL | Status: AC
  Administered 2021-08-04: 300 mg via ORAL
  Filled 2021-08-04: qty 1

## 2021-08-04 MED ORDER — OXYCODONE HCL 5 MG PO TABS
5.0000 mg | ORAL_TABLET | ORAL | Status: DC | PRN
  Administered 2021-08-04: 10 mg via ORAL

## 2021-08-04 MED ORDER — CHLORHEXIDINE GLUCONATE 0.12 % MT SOLN
15.0000 mL | Freq: Once | OROMUCOSAL | Status: AC
Start: 2021-08-04 — End: 2021-08-04
  Administered 2021-08-04: 15 mL via OROMUCOSAL

## 2021-08-04 MED ORDER — POVIDONE-IODINE 10 % EX SWAB
2.0000 "application " | Freq: Once | CUTANEOUS | Status: AC
  Administered 2021-08-04: 2 via TOPICAL

## 2021-08-04 MED ORDER — HYDROMORPHONE HCL 1 MG/ML IJ SOLN: 0.2500 mg | INTRAMUSCULAR | Status: DC | PRN

## 2021-08-04 MED ORDER — ONDANSETRON HCL 4 MG/2ML IJ SOLN
INTRAMUSCULAR | Status: DC | PRN
Start: 2021-08-04 — End: 2021-08-04
  Administered 2021-08-04: 4 mg via INTRAVENOUS

## 2021-08-04 MED ORDER — OXYCODONE HCL 5 MG PO TABS
ORAL_TABLET | ORAL | Status: AC
  Filled 2021-08-04: qty 2

## 2021-08-04 MED ORDER — DEXAMETHASONE SODIUM PHOSPHATE 4 MG/ML IJ SOLN
INTRAMUSCULAR | Status: DC | PRN
  Administered 2021-08-04: 5 mg via PERINEURAL

## 2021-08-04 MED ORDER — DIPHENHYDRAMINE HCL 50 MG/ML IJ SOLN
INTRAMUSCULAR | Status: DC | PRN
  Administered 2021-08-04: 25 mg via INTRAVENOUS

## 2021-08-04 MED ORDER — PHENYLEPHRINE HCL (PRESSORS) 10 MG/ML IV SOLN
INTRAVENOUS | Status: AC
  Filled 2021-08-04: qty 1

## 2021-08-04 MED ORDER — AMISULPRIDE (ANTIEMETIC) 5 MG/2ML IV SOLN: 10.0000 mg | Freq: Once | INTRAVENOUS | Status: DC | PRN

## 2021-08-04 MED ORDER — MIDAZOLAM HCL 5 MG/5ML IJ SOLN
INTRAMUSCULAR | Status: DC | PRN
  Administered 2021-08-04: 2 mg via INTRAVENOUS

## 2021-08-04 MED ORDER — PROPOFOL 500 MG/50ML IV EMUL
INTRAVENOUS | Status: DC | PRN
  Administered 2021-08-04: 100 ug/kg/min via INTRAVENOUS

## 2021-08-04 MED ORDER — BUPIVACAINE LIPOSOME 1.3 % IJ SUSP: 20.0000 mL | Freq: Once | INTRAMUSCULAR | Status: DC

## 2021-08-04 MED ORDER — TRANEXAMIC ACID-NACL 1000-0.7 MG/100ML-% IV SOLN: 1000.0000 mg | Freq: Once | INTRAVENOUS | Status: DC

## 2021-08-04 MED ORDER — FENTANYL CITRATE (PF) 100 MCG/2ML IJ SOLN
INTRAMUSCULAR | Status: AC
  Filled 2021-08-04: qty 2

## 2021-08-04 MED ORDER — VANCOMYCIN HCL IN DEXTROSE 1-5 GM/200ML-% IV SOLN
1000.0000 mg | INTRAVENOUS | Status: AC
  Administered 2021-08-04: 1000 mg via INTRAVENOUS
  Filled 2021-08-04: qty 200

## 2021-08-04 MED ORDER — SODIUM CHLORIDE 0.9 % IV SOLN
INTRAVENOUS | Status: DC | PRN
  Administered 2021-08-04: 50 mL

## 2021-08-04 MED ORDER — TRANEXAMIC ACID-NACL 1000-0.7 MG/100ML-% IV SOLN
1000.0000 mg | INTRAVENOUS | Status: AC
  Administered 2021-08-04: 1000 mg via INTRAVENOUS
  Filled 2021-08-04: qty 100

## 2021-08-04 MED ORDER — ROPIVACAINE HCL 5 MG/ML IJ SOLN
INTRAMUSCULAR | Status: DC | PRN
  Administered 2021-08-04: 30 mL via PERINEURAL

## 2021-08-04 MED ORDER — CELECOXIB 200 MG PO CAPS: 200.0000 mg | ORAL_CAPSULE | Freq: Two times a day (BID) | ORAL | 2 refills | Status: AC

## 2021-08-04 MED ORDER — ACETAMINOPHEN 500 MG PO TABS: 1000.0000 mg | ORAL_TABLET | Freq: Four times a day (QID) | ORAL | 2 refills | Status: AC | PRN

## 2021-08-04 MED ORDER — PHENYLEPHRINE 80 MCG/ML (10ML) SYRINGE FOR IV PUSH (FOR BLOOD PRESSURE SUPPORT)
PREFILLED_SYRINGE | INTRAVENOUS | Status: AC
  Filled 2021-08-04: qty 10

## 2021-08-04 MED ORDER — BUPIVACAINE IN DEXTROSE 0.75-8.25 % IT SOLN
INTRATHECAL | Status: DC | PRN
  Administered 2021-08-04: 1.5 mL via INTRATHECAL

## 2021-08-04 MED ORDER — METHOCARBAMOL 500 MG IVPB - SIMPLE MED
500.0000 mg | Freq: Four times a day (QID) | INTRAVENOUS | Status: DC | PRN
Start: 2021-08-04 — End: 2021-08-04

## 2021-08-04 MED ORDER — PROPOFOL 1000 MG/100ML IV EMUL
INTRAVENOUS | Status: AC
  Filled 2021-08-04: qty 100

## 2021-08-04 MED ORDER — MEPERIDINE HCL 50 MG/ML IJ SOLN: 6.2500 mg | INTRAMUSCULAR | Status: DC | PRN

## 2021-08-04 MED ORDER — OXYCODONE HCL 5 MG PO TABS: ORAL_TABLET | ORAL | 0 refills | Status: AC

## 2021-08-04 MED ORDER — METHOCARBAMOL 500 MG PO TABS
ORAL_TABLET | ORAL | Status: AC
  Filled 2021-08-04: qty 1

## 2021-08-04 SURGICAL SUPPLY — 58 items
ATTUNE MED DOME PAT 32 KNEE (Knees) ×1 IMPLANT
ATTUNE PS FEM RT SZ 4 CEM KNEE (Femur) ×1 IMPLANT
ATTUNE PSRP INSR SZ4 5 KNEE (Insert) ×1 IMPLANT
BAG COUNTER SPONGE SURGICOUNT (BAG) ×2 IMPLANT
BAG DECANTER FOR FLEXI CONT (MISCELLANEOUS) ×2 IMPLANT
BAG ZIPLOCK 12X15 (MISCELLANEOUS) ×2 IMPLANT
BASE TIBIAL ROT PLAT SZ 3 KNEE (Knees) IMPLANT
BLADE SAGITTAL 25.0X1.19X90 (BLADE) ×2 IMPLANT
BLADE SAW SGTL 13X75X1.27 (BLADE) ×2 IMPLANT
BLADE SURG 15 STRL LF DISP TIS (BLADE) ×1 IMPLANT
BLADE SURG 15 STRL SS (BLADE) ×2
BLADE SURG SZ10 CARB STEEL (BLADE) ×4 IMPLANT
BNDG ELASTIC 6X15 VLCR STRL LF (GAUZE/BANDAGES/DRESSINGS) ×2 IMPLANT
BONE CEMENT GENTAMICIN (Cement) ×4 IMPLANT
BOWL SMART MIX CTS (DISPOSABLE) ×2 IMPLANT
CEMENT BONE GENTAMICIN 40 (Cement) IMPLANT
CLSR STERI-STRIP ANTIMIC 1/2X4 (GAUZE/BANDAGES/DRESSINGS) ×4 IMPLANT
COVER SURGICAL LIGHT HANDLE (MISCELLANEOUS) ×2 IMPLANT
CUFF TOURN SGL QUICK 34 (TOURNIQUET CUFF) ×2
CUFF TRNQT CYL 34X4.125X (TOURNIQUET CUFF) ×1 IMPLANT
DRAPE INCISE IOBAN 66X45 STRL (DRAPES) ×2 IMPLANT
DRAPE U-SHAPE 47X51 STRL (DRAPES) ×2 IMPLANT
DRESSING AQUACEL AG SP 3.5X10 (GAUZE/BANDAGES/DRESSINGS) ×1 IMPLANT
DRSG AQUACEL AG SP 3.5X10 (GAUZE/BANDAGES/DRESSINGS) ×2
DURAPREP 26ML APPLICATOR (WOUND CARE) ×4 IMPLANT
ELECT REM PT RETURN 15FT ADLT (MISCELLANEOUS) ×2 IMPLANT
GLOVE BIOGEL PI IND STRL 8 (GLOVE) ×2 IMPLANT
GLOVE BIOGEL PI INDICATOR 8 (GLOVE) ×2
GLOVE SURG ORTHO 8.0 STRL STRW (GLOVE) ×2 IMPLANT
GLOVE SURG POLYISO LF SZ7.5 (GLOVE) ×2 IMPLANT
GOWN STRL REUS W/ TWL XL LVL3 (GOWN DISPOSABLE) ×2 IMPLANT
GOWN STRL REUS W/TWL XL LVL3 (GOWN DISPOSABLE) ×4
HANDPIECE INTERPULSE COAX TIP (DISPOSABLE) ×2
HOLDER FOLEY CATH W/STRAP (MISCELLANEOUS) ×1 IMPLANT
HOOD PEEL AWAY FLYTE STAYCOOL (MISCELLANEOUS) ×2 IMPLANT
IMMOBILIZER KNEE 20 (SOFTGOODS) ×2
IMMOBILIZER KNEE 20 THIGH 36 (SOFTGOODS) ×1 IMPLANT
KIT TURNOVER KIT A (KITS) IMPLANT
MANIFOLD NEPTUNE II (INSTRUMENTS) ×2 IMPLANT
NEEDLE HYPO 22GX1.5 SAFETY (NEEDLE) ×4 IMPLANT
NS IRRIG 1000ML POUR BTL (IV SOLUTION) ×2 IMPLANT
PACK TOTAL KNEE CUSTOM (KITS) ×2 IMPLANT
PROTECTOR NERVE ULNAR (MISCELLANEOUS) ×2 IMPLANT
SET HNDPC FAN SPRY TIP SCT (DISPOSABLE) ×1 IMPLANT
SPIKE FLUID TRANSFER (MISCELLANEOUS) ×4 IMPLANT
SUT ETHIBOND NAB CT1 #1 30IN (SUTURE) ×4 IMPLANT
SUT MNCRL AB 3-0 PS2 18 (SUTURE) ×2 IMPLANT
SUT VIC AB 0 CT1 36 (SUTURE) ×2 IMPLANT
SUT VIC AB 2-0 CT1 27 (SUTURE) ×4
SUT VIC AB 2-0 CT1 TAPERPNT 27 (SUTURE) ×2 IMPLANT
SYR CONTROL 10ML LL (SYRINGE) ×6 IMPLANT
TIBIAL BASE ROT PLAT SZ 3 KNEE (Knees) ×2 IMPLANT
TOWEL OR 17X26 10 PK STRL BLUE (TOWEL DISPOSABLE) ×2 IMPLANT
TRAY FOLEY MTR SLVR 14FR STAT (SET/KITS/TRAYS/PACK) ×1 IMPLANT
TRAY FOLEY MTR SLVR 16FR STAT (SET/KITS/TRAYS/PACK) ×1 IMPLANT
TUBE SUCTION HIGH CAP CLEAR NV (SUCTIONS) ×2 IMPLANT
WATER STERILE IRR 1000ML POUR (IV SOLUTION) ×4 IMPLANT
WRAP KNEE MAXI GEL POST OP (GAUZE/BANDAGES/DRESSINGS) ×2 IMPLANT

## 2021-08-04 NOTE — Brief Op Note (Signed)
08/04/2021  10:12 AM  PATIENT:  Joann Watson  56 y.o. female  PRE-OPERATIVE DIAGNOSIS:  OA RIGHT KNEE  POST-OPERATIVE DIAGNOSIS:  OA RIGHT KNEE  PROCEDURE:  Procedure(s): TOTAL KNEE ARTHROPLASTY (Right)  SURGEON:  Surgeon(s) and Role:    * Frederico Hamman, MD - Primary  PHYSICIAN ASSISTANT: Margart Sickles, PA-C  ASSISTANTS: OR staff x1   ANESTHESIA:   local, regional, spinal, and IV sedation  EBL:  50 mL   BLOOD ADMINISTERED:none  DRAINS: none   LOCAL MEDICATIONS USED:  MARCAINE     SPECIMEN:  No Specimen  DISPOSITION OF SPECIMEN:  N/A  COUNTS:  YES  TOURNIQUET:   Total Tourniquet Time Documented: Thigh (Right) - 52 minutes Total: Thigh (Right) - 52 minutes   DICTATION: .Other Dictation: Dictation Number unknown  PLAN OF CARE: Discharge to home after PACU  PATIENT DISPOSITION:  PACU - hemodynamically stable.   Delay start of Pharmacological VTE agent (>24hrs) due to surgical blood loss or risk of bleeding: yes

## 2021-08-04 NOTE — Anesthesia Procedure Notes (Addendum)
Spinal  Patient location during procedure: OR Start time: 08/04/2021 7:41 AM End time: 08/04/2021 7:57 AM Reason for block: surgical anesthesia Staffing Performed: anesthesiologist  Anesthesiologist: Nolon Nations, MD Resident/CRNA: Jonna Munro, CRNA Preanesthetic Checklist Completed: patient identified, IV checked, site marked, risks and benefits discussed, surgical consent, monitors and equipment checked, pre-op evaluation and timeout performed Spinal Block Patient position: sitting Prep: DuraPrep and site prepped and draped Patient monitoring: heart rate, continuous pulse ox and blood pressure Approach: midline Location: L3-4 Injection technique: single-shot Needle Needle type: Spinocan  Needle gauge: 25 G Needle length: 9 cm Assessment Events: second provider Additional Notes Expiration date of kit checked and confirmed. Patient tolerated procedure well, without complications.

## 2021-08-04 NOTE — Interval H&P Note (Signed)
History and Physical Interval Note:  08/04/2021 7:36 AM  Joann Watson  has presented today for surgery, with the diagnosis of OA RIGHT KNEE.  The various methods of treatment have been discussed with the patient and family. After consideration of risks, benefits and other options for treatment, the patient has consented to  Procedure(s): TOTAL KNEE ARTHROPLASTY (Right) as a surgical intervention.  The patient's history has been reviewed, patient examined, no change in status, stable for surgery.  I have reviewed the patient's chart and labs.  Questions were answered to the patient's satisfaction.     Thera Flake

## 2021-08-04 NOTE — Transfer of Care (Signed)
Immediate Anesthesia Transfer of Care Note  Patient: AMANI MARSEILLE  Procedure(s) Performed: TOTAL KNEE ARTHROPLASTY (Right: Knee)  Patient Location: PACU  Anesthesia Type:Spinal  Level of Consciousness: awake, alert  and patient cooperative  Airway & Oxygen Therapy: Patient Spontanous Breathing and Patient connected to face mask oxygen  Post-op Assessment: Report given to RN and Post -op Vital signs reviewed and stable  Post vital signs: Reviewed and stable  Last Vitals:  Vitals Value Taken Time  BP 85/55 08/04/21 1005  Temp 36.4 C 08/04/21 1005  Pulse 53 08/04/21 1007  Resp 11 08/04/21 1007  SpO2 100 % 08/04/21 1007  Vitals shown include unvalidated device data.  Last Pain:  Vitals:   08/04/21 0558  TempSrc: Oral  PainSc: 0-No pain         Complications: No notable events documented.

## 2021-08-04 NOTE — Progress Notes (Signed)
Orthopedic Tech Progress Note Patient Details:  [name] 1965/03/28 [id number]  Ortho Devices Type of Ortho Device: Bone foam zero knee Ortho Device/Splint Location: RLE Ortho Device/Splint Interventions: Application   Post Interventions Patient Tolerated: Well  Linus Salmons Paulla Mcclaskey 08/04/2021, 10:57 AM

## 2021-08-04 NOTE — Discharge Instructions (Signed)

## 2021-08-04 NOTE — Anesthesia Postprocedure Evaluation (Signed)
Anesthesia Post Note  Patient: Joann Watson  Procedure(s) Performed: TOTAL KNEE ARTHROPLASTY (Right: Knee)     Patient location during evaluation: PACU Anesthesia Type: Spinal Level of consciousness: sedated and patient cooperative Pain management: pain level controlled Vital Signs Assessment: post-procedure vital signs reviewed and stable Respiratory status: spontaneous breathing Cardiovascular status: stable Anesthetic complications: no   No notable events documented.  Last Vitals:  Vitals:   08/04/21 1300 08/04/21 1408  BP: 96/63 106/71  Pulse: (!) 59 62  Resp: 14   Temp:    SpO2: 98% 98%    Last Pain:  Vitals:   08/04/21 1300  TempSrc:   PainSc: 0-No pain                 Lewie Loron

## 2021-08-04 NOTE — Op Note (Unsigned)
NAME: Joann Watson, Joann Watson MEDICAL RECORD NO: [id number] ACCOUNT NO: [id number] DATE OF BIRTH: 1965-03-28 FACILITY: [facility] LOCATION: [facility] PHYSICIAN: W D. Valeta Harms., MD  Operative Report   DATE OF PROCEDURE: 08/04/2021   PREOPERATIVE DIAGNOSES:  Severe osteoarthritis, right knee.  POSTOPERATIVE DIAGNOSIS:  Severe osteoarthritis, right knee.   PROCEDURE:  Right total knee replacement (Attune cemented knee, size 4 femur *** tibial bearing size 3 tibia with 32 mm all-poly patella).  SURGEON: W D. Valeta Harms., MD  ASSISTANTMarjo Bicker, PA.  ANESTHESIA:  Spinal with block.  DESCRIPTION OF PROCEDURE:  Straight skin incision with medial parapatellar approach to the knee.  We did a 5-degree 10 mm resection of the distal femur, followed by cutting about 3 mm below the most diseased medial compartment with the extension gap.   Lesion measured at 5 mm.  Femur was sized to be a size 4, placement of the all-in-1 cutting block in the appropriate degree of external rotation, accomplishing the anterior, posterior and chamfer cuts.  Complete release of the PCL was done as well as  removal of posterior osteophytes from the posterior aspect of the knee.  We infiltrated the subcutaneous and capsular tissues with a mixture of Marcaine and Exparel.  Tibia was sized to be a size 3.  We cut the keel cut for the tibia followed by the box  cut on the femur.  Resected 9.5 mm off the patella and placed a 32 mm trial patella.  The patient had a preoperative flexion contracture and mild varus to the knee.  Good alignment was obtained with full extension noted and good balancing of the  ligaments.  Cement was prepared on the back table with antibiotic-impregnated cement due to the patient being Staph positive.  We placed the tibia followed by femur, patella with the trial bearing.  Cement was allowed to harden.  The trial bearing was  removed.  Small bits of cement were removed from the posterior aspect of the knee.   Tourniquet was released under direct vision.  No excessive bleeding was noted.  We placed the final bearing.  Closure was affected with #1 Ethibond, 2-0 Vicryl and  Monocryl in the skin.  Taken to recovery room in stable condition.     Elián.Darby D: 08/04/2021 9:28:29 am T: 08/04/2021 10:45:00 am  JOB: [id number]

## 2021-08-04 NOTE — Evaluation (Signed)
Physical Therapy Evaluation Patient Details Name: Joann Watson MRN: [id number] DOB: 02-Aug-1965 Today's Date: 08/04/2021  History of Present Illness  56 yo female S/P RTKA, HO LTKA 12/2020  Clinical Impression  Patient ambulated 50'x 2, Noted right  knee extension lag, did buckle inside KI x 1. Cautioned patient to wear KI  and be careful if not in [place]. Patient is cleared for DC  home.       Recommendations for follow up therapy are one component of a multi-disciplinary discharge planning process, led by the attending physician.  Recommendations may be updated based on patient status, additional functional criteria and insurance authorization.  Follow Up Recommendations Follow physician's recommendations for discharge plan and follow up therapies    Assistance Recommended at Discharge    Patient can return home with the following  A little help with walking and/or transfers;Assistance with cooking/housework;Assist for transportation;Help with stairs or ramp for entrance    Equipment Recommendations None recommended by PT  Recommendations for Other Services       Functional Status Assessment Patient has had a recent decline in their functional status and demonstrates the ability to make significant improvements in function in a reasonable and predictable amount of time.     Precautions / Restrictions Precautions Precautions: Fall;Knee Required Braces or Orthoses: Knee Immobilizer - Right Knee Immobilizer - Right: On when out of bed or walking      Mobility  Bed Mobility Overal bed mobility: Modified Independent                  Transfers Overall transfer level: Needs assistance Equipment used: Rolling walker (2 wheels) Transfers: Sit to/from Stand Sit to Stand: Min assist           General transfer comment: cues for safety    Ambulation/Gait Ambulation/Gait assistance: Min guard, Min assist Gait Distance (Feet): 50 Feet (x 2) Assistive device: Rolling  walker (2 wheels) Gait Pattern/deviations: Step-to pattern, Step-through pattern       General Gait Details: Right  knee buckled in KI x 1  Stairs            Wheelchair Mobility    Modified Rankin (Stroke Patients Only)       Balance Overall balance assessment: No apparent balance deficits (not formally assessed)                                           Pertinent Vitals/Pain Pain Assessment Pain Assessment: 0-10 Pain Score: 2  Pain Descriptors / Indicators: Operative site guarding Pain Intervention(s): Monitored during session    Home Living Family/patient expects to be discharged to:: Private residence Living Arrangements: Spouse/significant other Available Help at Discharge: Family Type of Home: House Home Access: Level entry       Home Layout: One level Home Equipment: Conservation officer, nature (2 wheels)      Prior Function Prior Level of Function : Independent/Modified Independent                     Hand Dominance   Dominant Hand: Right    Extremity/Trunk Assessment   Upper Extremity Assessment Upper Extremity Assessment: Overall WFL for tasks assessed    Lower Extremity Assessment Lower Extremity Assessment: RLE deficits/detail RLE Deficits / Details: lag with SLR    Cervical / Trunk Assessment Cervical / Trunk Assessment: Normal  Communication   Communication: No  difficulties  Cognition Arousal/Alertness: Awake/alert Behavior During Therapy: WFL for tasks assessed/performed Overall Cognitive Status: Within Functional Limits for tasks assessed                                          General Comments      Exercises Total Joint Exercises Ankle Circles/Pumps: AROM Quad Sets: AROM, Both, 5 reps Hip ABduction/ADduction: AROM, Right, 5 reps Straight Leg Raises: AROM, Right, 5 reps   Assessment/Plan    PT Assessment All further PT needs can be met in the next venue of care  PT Problem List  Decreased strength;Decreased range of motion;Decreased mobility;Decreased knowledge of use of DME;Decreased activity tolerance       PT Treatment Interventions      PT Goals (Current goals can be found in the Care Plan section)  Acute Rehab PT Goals Patient Stated Goal: go home PT Goal Formulation: All assessment and education complete, DC therapy    Frequency       Co-evaluation               AM-PAC PT "6 Clicks" Mobility  Outcome Measure Help needed turning from your back to your side while in a flat bed without using bedrails?: None Help needed moving from lying on your back to sitting on the side of a flat bed without using bedrails?: None Help needed moving to and from a bed to a chair (including a wheelchair)?: A Little Help needed standing up from a chair using your arms (e.g., wheelchair or bedside chair)?: A Little Help needed to walk in hospital room?: A Little Help needed climbing 3-5 steps with a railing? : A Little 6 Click Score: 20    End of Session Equipment Utilized During Treatment: Gait belt Activity Tolerance: Patient tolerated treatment well Patient left: in bed;with call bell/phone within reach Nurse Communication: Mobility status PT Visit Diagnosis: Difficulty in walking, not elsewhere classified (R26.2)    Time: 5681-2751 PT Time Calculation (min) (ACUTE ONLY): 28 min   Charges:   PT Evaluation $PT Eval Low Complexity: 1 Low PT Treatments $Gait Training: 8-22 mins        Tresa Endo PT Acute Rehabilitation Services Pager [phone] Office [phone]   Claretha Cooper 08/04/2021, 2:09 PM

## 2021-08-04 NOTE — Anesthesia Procedure Notes (Signed)
Anesthesia Regional Block: Adductor canal block   Pre-Anesthetic Checklist: , timeout performed,  Correct Patient, Correct Site, Correct Laterality,  Correct Procedure, Correct Position, site marked,  Risks and benefits discussed,  Surgical consent,  Pre-op evaluation,  At surgeon's request and post-op pain management  Laterality: Lower  Prep: chloraprep       Needles:  Injection technique: Single-shot  Needle Type: Stimiplex     Needle Length: 9cm  Needle Gauge: 21     Additional Needles:   Procedures:,,,, ultrasound used (permanent image in chart),,    Narrative:  Start time: 08/04/2021 6:51 AM End time: 08/04/2021 7:11 AM Injection made incrementally with aspirations every 5 mL.  Performed by: Personally  Anesthesiologist: Lewie Loron, MD  Additional Notes: BP cuff, EKG monitors applied. Sedation begun. Artery and nerve location verified with ultrasound. Anesthetic injected incrementally (56ml), slowly, and after negative aspirations under direct u/s guidance. Good fascial/perineural spread. Tolerated well.

## 2021-08-04 NOTE — Progress Notes (Signed)
Some Hives noted on patient , per patient she has multiple environmental allergies and will      " wake up with hives " she further states " this has been going on for years and they eventually go away throughout the day " pt denies any further symptoms

## 2021-08-08 ENCOUNTER — Encounter (HOSPITAL_COMMUNITY): Payer: Self-pay | Admitting: Orthopedic Surgery
